# Patient Record
Sex: Male | Born: 1945 | Race: White | Hispanic: No | Marital: Married | Smoking: Current every day smoker
Health system: Southern US, Community
[De-identification: ages and names within clinical notes are randomized; demographics above are authoritative.]

## PROBLEM LIST (undated history)

## (undated) DIAGNOSIS — I2510 Atherosclerotic heart disease of native coronary artery without angina pectoris: Secondary | ICD-10-CM

## (undated) DIAGNOSIS — F419 Anxiety disorder, unspecified: Secondary | ICD-10-CM

## (undated) DIAGNOSIS — J40 Bronchitis, not specified as acute or chronic: Secondary | ICD-10-CM

## (undated) HISTORY — PX: HERNIA REPAIR: SHX51

---

## 2001-10-20 ENCOUNTER — Encounter (HOSPITAL_COMMUNITY): Admission: RE | Admit: 2001-10-20 | Discharge: 2002-01-18 | Payer: Self-pay | Admitting: Family Medicine

## 2002-05-12 ENCOUNTER — Encounter (HOSPITAL_COMMUNITY): Admission: RE | Admit: 2002-05-12 | Discharge: 2002-08-10 | Payer: Self-pay | Admitting: Family Medicine

## 2003-01-15 ENCOUNTER — Encounter (HOSPITAL_COMMUNITY): Admission: RE | Admit: 2003-01-15 | Discharge: 2003-04-15 | Payer: Self-pay | Admitting: Family Medicine

## 2003-05-07 ENCOUNTER — Encounter (HOSPITAL_COMMUNITY): Admission: RE | Admit: 2003-05-07 | Discharge: 2003-08-05 | Payer: Self-pay | Admitting: Family Medicine

## 2003-10-22 ENCOUNTER — Encounter: Admission: RE | Admit: 2003-10-22 | Discharge: 2004-01-20 | Payer: Self-pay | Admitting: Family Medicine

## 2004-03-03 ENCOUNTER — Encounter (HOSPITAL_COMMUNITY): Admission: RE | Admit: 2004-03-03 | Discharge: 2004-03-06 | Payer: Self-pay | Admitting: Family Medicine

## 2004-06-16 ENCOUNTER — Encounter (HOSPITAL_COMMUNITY): Admission: RE | Admit: 2004-06-16 | Discharge: 2004-09-14 | Payer: Self-pay | Admitting: Family Medicine

## 2004-11-23 ENCOUNTER — Encounter (HOSPITAL_COMMUNITY): Admission: RE | Admit: 2004-11-23 | Discharge: 2005-02-21 | Payer: Self-pay | Admitting: Family Medicine

## 2005-04-20 ENCOUNTER — Encounter (HOSPITAL_COMMUNITY): Admission: RE | Admit: 2005-04-20 | Discharge: 2005-07-19 | Payer: Self-pay | Admitting: Family Medicine

## 2006-01-18 ENCOUNTER — Ambulatory Visit (HOSPITAL_COMMUNITY): Admission: RE | Admit: 2006-01-18 | Discharge: 2006-01-18 | Payer: Self-pay | Admitting: Family Medicine

## 2006-05-24 ENCOUNTER — Encounter (HOSPITAL_COMMUNITY): Admission: RE | Admit: 2006-05-24 | Discharge: 2006-08-07 | Payer: Self-pay | Admitting: Family Medicine

## 2006-11-01 ENCOUNTER — Encounter (HOSPITAL_COMMUNITY): Admission: RE | Admit: 2006-11-01 | Discharge: 2006-12-27 | Payer: Self-pay | Admitting: Family Medicine

## 2007-01-31 ENCOUNTER — Ambulatory Visit: Payer: Self-pay | Admitting: Internal Medicine

## 2007-04-15 ENCOUNTER — Encounter (HOSPITAL_COMMUNITY): Admission: RE | Admit: 2007-04-15 | Discharge: 2007-04-22 | Payer: Self-pay | Admitting: Family Medicine

## 2007-10-07 ENCOUNTER — Encounter (HOSPITAL_COMMUNITY): Admission: RE | Admit: 2007-10-07 | Discharge: 2007-12-24 | Payer: Self-pay | Admitting: Family Medicine

## 2008-03-05 ENCOUNTER — Ambulatory Visit (HOSPITAL_COMMUNITY): Admission: RE | Admit: 2008-03-05 | Discharge: 2008-03-05 | Payer: Self-pay | Admitting: Family Medicine

## 2008-03-19 ENCOUNTER — Ambulatory Visit (HOSPITAL_COMMUNITY): Admission: RE | Admit: 2008-03-19 | Discharge: 2008-03-19 | Payer: Self-pay | Admitting: Family Medicine

## 2008-07-02 ENCOUNTER — Ambulatory Visit (HOSPITAL_COMMUNITY): Admission: RE | Admit: 2008-07-02 | Discharge: 2008-07-02 | Payer: Self-pay | Admitting: Family Medicine

## 2008-07-23 ENCOUNTER — Ambulatory Visit (HOSPITAL_COMMUNITY): Admission: RE | Admit: 2008-07-23 | Discharge: 2008-07-23 | Payer: Self-pay | Admitting: Family Medicine

## 2009-02-16 ENCOUNTER — Encounter (HOSPITAL_COMMUNITY): Admission: RE | Admit: 2009-02-16 | Discharge: 2009-05-17 | Payer: Self-pay | Admitting: Family Medicine

## 2009-07-05 ENCOUNTER — Encounter (HOSPITAL_COMMUNITY): Admission: RE | Admit: 2009-07-05 | Discharge: 2009-08-25 | Payer: Self-pay | Admitting: Family Medicine

## 2009-11-04 ENCOUNTER — Ambulatory Visit (HOSPITAL_COMMUNITY): Admission: RE | Admit: 2009-11-04 | Discharge: 2009-11-04 | Payer: Self-pay | Admitting: Family Medicine

## 2010-01-13 ENCOUNTER — Encounter (HOSPITAL_COMMUNITY)
Admission: RE | Admit: 2010-01-13 | Discharge: 2010-04-13 | Payer: Self-pay | Source: Home / Self Care | Attending: Family Medicine | Admitting: Family Medicine

## 2010-05-14 ENCOUNTER — Encounter: Payer: Self-pay | Admitting: Family Medicine

## 2011-01-23 LAB — FERRITIN: Ferritin: 63 (ref 22–322)

## 2011-02-16 ENCOUNTER — Other Ambulatory Visit: Payer: Self-pay | Admitting: Family Medicine

## 2011-02-16 ENCOUNTER — Ambulatory Visit (HOSPITAL_COMMUNITY): Payer: Self-pay | Attending: Family Medicine

## 2011-02-16 LAB — POCT HEMOGLOBIN-HEMACUE: Hemoglobin: 19.6 g/dL — ABNORMAL HIGH (ref 13.0–17.0)

## 2011-06-04 DIAGNOSIS — M62838 Other muscle spasm: Secondary | ICD-10-CM | POA: Diagnosis not present

## 2011-06-04 DIAGNOSIS — Z125 Encounter for screening for malignant neoplasm of prostate: Secondary | ICD-10-CM | POA: Diagnosis not present

## 2011-06-04 DIAGNOSIS — F411 Generalized anxiety disorder: Secondary | ICD-10-CM | POA: Diagnosis not present

## 2011-06-04 DIAGNOSIS — Z1211 Encounter for screening for malignant neoplasm of colon: Secondary | ICD-10-CM | POA: Diagnosis not present

## 2011-06-04 DIAGNOSIS — Z23 Encounter for immunization: Secondary | ICD-10-CM | POA: Diagnosis not present

## 2011-06-04 DIAGNOSIS — Z0000 Encounter for general adult medical examination without abnormal findings: Secondary | ICD-10-CM | POA: Diagnosis not present

## 2011-06-04 DIAGNOSIS — E7800 Pure hypercholesterolemia, unspecified: Secondary | ICD-10-CM | POA: Diagnosis not present

## 2011-07-09 DIAGNOSIS — N401 Enlarged prostate with lower urinary tract symptoms: Secondary | ICD-10-CM | POA: Diagnosis not present

## 2011-07-09 DIAGNOSIS — R972 Elevated prostate specific antigen [PSA]: Secondary | ICD-10-CM | POA: Diagnosis not present

## 2011-07-09 DIAGNOSIS — N529 Male erectile dysfunction, unspecified: Secondary | ICD-10-CM | POA: Diagnosis not present

## 2011-07-09 DIAGNOSIS — E291 Testicular hypofunction: Secondary | ICD-10-CM | POA: Diagnosis not present

## 2011-09-03 DIAGNOSIS — E7800 Pure hypercholesterolemia, unspecified: Secondary | ICD-10-CM | POA: Diagnosis not present

## 2011-09-04 ENCOUNTER — Other Ambulatory Visit: Payer: Self-pay | Admitting: Family Medicine

## 2011-09-14 ENCOUNTER — Ambulatory Visit (HOSPITAL_COMMUNITY)
Admission: RE | Admit: 2011-09-14 | Discharge: 2011-09-14 | Disposition: A | Discharge status: Home / Self Care | Payer: Medicare Other | Source: Ambulatory Visit | Attending: Family Medicine | Admitting: Family Medicine

## 2011-09-14 MED ORDER — LIDOCAINE HCL 1 % IJ SOLN
0.1000 mL | Freq: Once | INTRAMUSCULAR | Status: DC
  Filled 2011-09-14: qty 0.1

## 2011-09-14 MED ORDER — LIDOCAINE HCL 2 % IJ SOLN: 0.1000 mL | Freq: Once | INTRAMUSCULAR | Status: DC

## 2011-09-14 MED ORDER — LIDOCAINE 4 % EX CREA: TOPICAL_CREAM | Freq: Once | CUTANEOUS | Status: DC

## 2011-09-14 MED ORDER — LIDOCAINE HCL (PF) 1 % IJ SOLN
INTRAMUSCULAR | Status: AC
  Administered 2011-09-14: 5 mL
  Filled 2011-09-14: qty 5

## 2011-09-19 DIAGNOSIS — R972 Elevated prostate specific antigen [PSA]: Secondary | ICD-10-CM | POA: Diagnosis not present

## 2011-09-26 DIAGNOSIS — R972 Elevated prostate specific antigen [PSA]: Secondary | ICD-10-CM | POA: Diagnosis not present

## 2011-09-26 DIAGNOSIS — N401 Enlarged prostate with lower urinary tract symptoms: Secondary | ICD-10-CM | POA: Diagnosis not present

## 2011-11-02 ENCOUNTER — Other Ambulatory Visit: Payer: Self-pay | Admitting: Family Medicine

## 2011-11-09 ENCOUNTER — Ambulatory Visit (HOSPITAL_COMMUNITY)
Admission: RE | Admit: 2011-11-09 | Discharge: 2011-11-09 | Disposition: A | Discharge status: Home / Self Care | Payer: Medicare Other | Source: Ambulatory Visit | Attending: Family Medicine | Admitting: Family Medicine

## 2011-11-09 MED ORDER — LIDOCAINE 4 % EX CREA
TOPICAL_CREAM | Freq: Once | CUTANEOUS | Status: DC
  Filled 2011-11-09: qty 5

## 2011-11-09 MED ORDER — LIDOCAINE HCL (PF) 1 % IJ SOLN
INTRAMUSCULAR | Status: AC
  Administered 2011-11-09: 0.3 mL
  Filled 2011-11-09: qty 2

## 2011-11-09 NOTE — Progress Notes (Signed)
1340 500 cc phlebotomy left acf without incident.  Patient taking po's. Family at bedside

## 2011-12-25 DIAGNOSIS — R972 Elevated prostate specific antigen [PSA]: Secondary | ICD-10-CM | POA: Diagnosis not present

## 2011-12-31 DIAGNOSIS — E291 Testicular hypofunction: Secondary | ICD-10-CM | POA: Diagnosis not present

## 2011-12-31 DIAGNOSIS — N401 Enlarged prostate with lower urinary tract symptoms: Secondary | ICD-10-CM | POA: Diagnosis not present

## 2011-12-31 DIAGNOSIS — R972 Elevated prostate specific antigen [PSA]: Secondary | ICD-10-CM | POA: Diagnosis not present

## 2012-01-22 DIAGNOSIS — F411 Generalized anxiety disorder: Secondary | ICD-10-CM | POA: Diagnosis not present

## 2012-01-22 DIAGNOSIS — R252 Cramp and spasm: Secondary | ICD-10-CM | POA: Diagnosis not present

## 2012-01-22 DIAGNOSIS — B356 Tinea cruris: Secondary | ICD-10-CM | POA: Diagnosis not present

## 2012-01-22 DIAGNOSIS — E7800 Pure hypercholesterolemia, unspecified: Secondary | ICD-10-CM | POA: Diagnosis not present

## 2012-01-22 DIAGNOSIS — L989 Disorder of the skin and subcutaneous tissue, unspecified: Secondary | ICD-10-CM | POA: Diagnosis not present

## 2012-01-22 DIAGNOSIS — J209 Acute bronchitis, unspecified: Secondary | ICD-10-CM | POA: Diagnosis not present

## 2012-01-23 ENCOUNTER — Other Ambulatory Visit: Payer: Self-pay | Admitting: Family Medicine

## 2012-01-31 ENCOUNTER — Encounter (HOSPITAL_COMMUNITY)
Admission: RE | Admit: 2012-01-31 | Discharge: 2012-01-31 | Disposition: A | Discharge status: Home / Self Care | Payer: Medicare Other | Source: Ambulatory Visit | Attending: Family Medicine | Admitting: Family Medicine

## 2012-01-31 MED ORDER — LIDOCAINE HCL 2 % IJ SOLN
0.1000 mL | Freq: Once | INTRAMUSCULAR | Status: AC
  Administered 2012-01-31: 2 mg via INTRADERMAL
  Filled 2012-01-31: qty 10

## 2012-01-31 MED ORDER — LIDOCAINE 5 % EX OINT: TOPICAL_OINTMENT | Freq: Once | CUTANEOUS | Status: DC

## 2012-01-31 NOTE — Progress Notes (Signed)
1225 500 cc phlebotomy left antecubital.  Patient tolerated well. Taking po's.

## 2012-02-01 DIAGNOSIS — L570 Actinic keratosis: Secondary | ICD-10-CM | POA: Diagnosis not present

## 2012-02-01 DIAGNOSIS — D239 Other benign neoplasm of skin, unspecified: Secondary | ICD-10-CM | POA: Diagnosis not present

## 2012-02-01 DIAGNOSIS — L821 Other seborrheic keratosis: Secondary | ICD-10-CM | POA: Diagnosis not present

## 2012-02-01 DIAGNOSIS — D485 Neoplasm of uncertain behavior of skin: Secondary | ICD-10-CM | POA: Diagnosis not present

## 2012-02-03 ENCOUNTER — Emergency Department (HOSPITAL_COMMUNITY)
Admission: EM | Admit: 2012-02-03 | Discharge: 2012-02-03 | Disposition: A | Discharge status: Home / Self Care | Payer: Medicare Other | Attending: Emergency Medicine | Admitting: Emergency Medicine

## 2012-02-03 ENCOUNTER — Encounter (HOSPITAL_COMMUNITY): Payer: Self-pay | Admitting: *Deleted

## 2012-02-03 ENCOUNTER — Other Ambulatory Visit: Payer: Self-pay

## 2012-02-03 DIAGNOSIS — R110 Nausea: Secondary | ICD-10-CM | POA: Insufficient documentation

## 2012-02-03 DIAGNOSIS — F10239 Alcohol dependence with withdrawal, unspecified: Secondary | ICD-10-CM

## 2012-02-03 DIAGNOSIS — F10939 Alcohol use, unspecified with withdrawal, unspecified: Secondary | ICD-10-CM | POA: Insufficient documentation

## 2012-02-03 DIAGNOSIS — R42 Dizziness and giddiness: Secondary | ICD-10-CM | POA: Insufficient documentation

## 2012-02-03 DIAGNOSIS — I2510 Atherosclerotic heart disease of native coronary artery without angina pectoris: Secondary | ICD-10-CM | POA: Insufficient documentation

## 2012-02-03 HISTORY — DX: Atherosclerotic heart disease of native coronary artery without angina pectoris: I25.10

## 2012-02-03 LAB — COMPREHENSIVE METABOLIC PANEL
CO2: 23 mEq/L (ref 19–32)
Calcium: 9.4 mg/dL (ref 8.4–10.5)
Creatinine, Ser: 1.06 mg/dL (ref 0.50–1.35)
GFR calc Af Amer: 83 mL/min — ABNORMAL LOW (ref >90)
GFR calc non Af Amer: 72 mL/min — ABNORMAL LOW (ref >90)
Glucose, Bld: 139 mg/dL — ABNORMAL HIGH (ref 70–99)

## 2012-02-03 LAB — URINALYSIS, ROUTINE W REFLEX MICROSCOPIC
Glucose, UA: NEGATIVE mg/dL
Hgb urine dipstick: NEGATIVE
Ketones, ur: 15 mg/dL — AB
Protein, ur: NEGATIVE mg/dL
Urobilinogen, UA: 1 mg/dL (ref 0.0–1.0)

## 2012-02-03 LAB — LIPASE, BLOOD: Lipase: 57 U/L (ref 11–59)

## 2012-02-03 LAB — CBC WITH DIFFERENTIAL/PLATELET
Basophils Relative: 0 % (ref 0–1)
Hemoglobin: 16.7 g/dL (ref 13.0–17.0)
Lymphs Abs: 2.9 10*3/uL (ref 0.7–4.0)
MCHC: 36.1 g/dL — ABNORMAL HIGH (ref 30.0–36.0)
Monocytes Relative: 8 % (ref 3–12)
Neutro Abs: 5.3 10*3/uL (ref 1.7–7.7)
Neutrophils Relative %: 58 % (ref 43–77)
RBC: 4.78 MIL/uL (ref 4.22–5.81)

## 2012-02-03 LAB — GLUCOSE, CAPILLARY: Glucose-Capillary: 133 mg/dL — ABNORMAL HIGH (ref 70–99)

## 2012-02-03 MED ORDER — LORAZEPAM 1 MG PO TABS: 1.0000 mg | ORAL_TABLET | Freq: Three times a day (TID) | ORAL | Status: DC | PRN

## 2012-02-03 MED ORDER — LORAZEPAM 1 MG PO TABS
1.0000 mg | ORAL_TABLET | Freq: Once | ORAL | Status: AC
  Administered 2012-02-03: 1 mg via ORAL
  Filled 2012-02-03: qty 1

## 2012-02-03 MED ORDER — ONDANSETRON 4 MG PO TBDP
8.0000 mg | ORAL_TABLET | Freq: Once | ORAL | Status: AC
  Administered 2012-02-03: 8 mg via ORAL
  Filled 2012-02-03: qty 2

## 2012-02-03 NOTE — ED Notes (Signed)
PT states that he started having shaking and lightheadedness that started around 0300.  CBG 133.  PT drinks everyday and states last drink was 1 days.  Pt states that he does not feel well.  Pt denies chest pain.  Pt usually drinks 6 beers per day

## 2012-02-03 NOTE — ED Notes (Signed)
Pt anxious to leave. MD Lynelle Doctor made aware. Apologized to pt for delay.

## 2012-02-03 NOTE — ED Notes (Signed)
Pt ambulated to bathroom for urine specimen.

## 2012-02-03 NOTE — ED Notes (Addendum)
Pt presents today with increased anxiety, dizziness, excess gas and overall "not feeling like myself" x 1 day. Pt reports drinking 6 beers/day, but "doesn't taste right to me." Pt last drank yesterday. Denies CP, SOB, or pain.

## 2012-02-03 NOTE — ED Provider Notes (Cosign Needed)
History     CSN: [id number]  Arrival date & time 02/03/12  1439   First MD Initiated Contact with Patient 02/03/12 1527      Chief Complaint  Patient presents with  . Dizziness  . Shaking    (Consider location/radiation/quality/duration/timing/severity/associated sxs/prior treatment) HPI  Patient reports he normally drinks about 6 beers a day. He states yesterday had some nausea and felt the need to eat. Reports about 3 AM he had nausea and has been having sweats off and on, he states he has a lot of abdominal gas but denies abdominal pain or diarrhea. He relates he has gout and felt like he was getting some pain in his right foot and ankle and took 2 Indocin. He reports he normally drinks 6 beers a day however he stopped drinking yesterday at 5 PM which is earlier than usual. He states he feels shaky and nervous. He also states he feels lightheaded. Despite the nausea he did eat normally today. Patient seems to feel his symptoms are related to switching brands of coffee. His wife states she notices a change in taste that she does not feel that switch.  Patient has hemochromatosis and he had a phlebotomy done 3 days ago  PCP Dr. Wynelle Link  Past Medical History  Diagnosis Date  . Coronary artery disease     Past Surgical History  Procedure Date  . Hernia repair     No family history on file.  History  Substance Use Topics  . Smoking status: Current Every Day Smoker 1 1/2 PPD  . Smokeless tobacco: Not on file  . Alcohol Use: Yes     6 beers per day   Lives with spouse Lives at home Retired  Review of Systems  All other systems reviewed and are negative.    Allergies  Other  Home Medications   Current Outpatient Rx  Name Route Sig Dispense Refill  . ALPRAZOLAM 0.5 MG PO TABS Oral Take 0.5 mg by mouth daily.    . ASPIRIN 325 MG PO TABS Oral Take 325 mg by mouth daily.    . INDOMETHACIN 50 MG PO CAPS Oral Take 50 mg by mouth 3 (three) times daily as needed. For gout  flare up    . ROSUVASTATIN CALCIUM 10 MG PO TABS Oral Take 10 mg by mouth daily.      BP 127/73  Pulse 63  Temp 97.5 F (36.4 C) (Oral)  Resp 18  SpO2 97%  Vital signs normal    Physical Exam  Nursing note and vitals reviewed. Constitutional: He is oriented to person, place, and time. He appears well-developed and well-nourished.  Non-toxic appearance. He does not appear ill. No distress.  HENT:  Head: Normocephalic and atraumatic.  Right Ear: External ear normal.  Left Ear: External ear normal.  Nose: Nose normal. No mucosal edema or rhinorrhea.  Mouth/Throat: Oropharynx is clear and moist and mucous membranes are normal. No dental abscesses or uvula swelling.  Eyes: Conjunctivae normal and EOM are normal. Pupils are equal, round, and reactive to light.  Neck: Normal range of motion and full passive range of motion without pain. Neck supple.  Cardiovascular: Normal rate, regular rhythm and normal heart sounds.  Exam reveals no gallop and no friction rub.   No murmur heard. Pulmonary/Chest: Effort normal and breath sounds normal. No respiratory distress. He has no wheezes. He has no rhonchi. He has no rales. He exhibits no tenderness and no crepitus.  Abdominal: Soft. Normal appearance and  bowel sounds are normal. He exhibits no distension. There is no tenderness. There is no rebound and no guarding.  Musculoskeletal: Normal range of motion. He exhibits no edema and no tenderness.       Moves all extremities well.   Neurological: He is alert and oriented to person, place, and time. He has normal strength. No cranial nerve deficit.       Pt has mild tremor of tongue and arms  Skin: Skin is warm, dry and intact. No rash noted. No erythema. No pallor.  Psychiatric: He has a normal mood and affect. His speech is normal and behavior is normal. His mood appears not anxious.    ED Course  Procedures (including critical care time)   Medications  ondansetron (ZOFRAN-ODT)  disintegrating tablet 8 mg (8 mg Oral Given 02/03/12 1647)  LORazepam (ATIVAN) e tablet 1 mg (1 mg Oral Given 02/03/12 1647)   Recheck at time of discharge. Patient states he feels much improved, the shakiness is all gone. He states he has talked to Dr. Wynelle Link about a alcohol treatment program but accepts Medicare and he is going to followup with her this week. He states he wants to quit drinking and he and his wife are also to stop smoking.   Results for orders placed during the hospital encounter of 02/03/12  COMPREHENSIVE METABOLIC PANEL      Component Value Range   Sodium 138  135 - 145 mEq/L   Potassium 3.4 (*) 3.5 - 5.1 mEq/L   Chloride 100  96 - 112 mEq/L   CO2 23  19 - 32 mEq/L   Glucose, Bld 139 (*) 70 - 99 mg/dL   BUN 11  6 - 23 mg/dL   Creatinine, Ser 9.60  0.50 - 1.35 mg/dL   Calcium 9.4  8.4 - 45.4 mg/dL   Total Protein 6.5  6.0 - 8.3 g/dL   Albumin 3.5  3.5 - 5.2 g/dL   AST 27  0 - 37 U/L   ALT 22  0 - 53 U/L   Alkaline Phosphatase 87  39 - 117 U/L   Total Bilirubin 0.9  0.3 - 1.2 mg/dL   GFR calc non Af Amer 72 (*) >90 mL/min   GFR calc Af Amer 83 (*) >90 mL/min  CBC WITH DIFFERENTIAL      Component Value Range   WBC 9.0  4.0 - 10.5 K/uL   RBC 4.78  4.22 - 5.81 MIL/uL   Hemoglobin 16.7  13.0 - 17.0 g/dL   HCT 09.8  11.9 - 14.7 %   MCV 96.9  78.0 - 100.0 fL   MCH 34.9 (*) 26.0 - 34.0 pg   MCHC 36.1 (*) 30.0 - 36.0 g/dL   RDW 82.9  56.2 - 13.0 %   Platelets 173  150 - 400 K/uL   Neutrophils Relative 58  43 - 77 %   Neutro Abs 5.3  1.7 - 7.7 K/uL   Lymphocytes Relative 32  12 - 46 %   Lymphs Abs 2.9  0.7 - 4.0 K/uL   Monocytes Relative 8  3 - 12 %   Monocytes Absolute 0.7  0.1 - 1.0 K/uL   Eosinophils Relative 1  0 - 5 %   Eosinophils Absolute 0.1  0.0 - 0.7 K/uL   Basophils Relative 0  0 - 1 %   Basophils Absolute 0.0  0.0 - 0.1 K/uL  GLUCOSE, CAPILLARY      Component Value Range   Glucose-Capillary 133 (*)  70 - 99 mg/dL  LIPASE, BLOOD      Component Value  Range   Lipase 57  11 - 59 U/L  TROPONIN I      Component Value Range   Troponin I <0.30  <0.30 ng/mL  URINALYSIS, ROUTINE W REFLEX MICROSCOPIC      Component Value Range   Color, Urine AMBER (*) YELLOW   APPearance CLEAR  CLEAR   Specific Gravity, Urine 1.024  1.005 - 1.030   pH 6.0  5.0 - 8.0   Glucose, UA NEGATIVE  NEGATIVE mg/dL   Hgb urine dipstick NEGATIVE  NEGATIVE   Bilirubin Urine SMALL (*) NEGATIVE   Ketones, ur 15 (*) NEGATIVE mg/dL   Protein, ur NEGATIVE  NEGATIVE mg/dL   Urobilinogen, UA 1.0  0.0 - 1.0 mg/dL   Nitrite NEGATIVE  NEGATIVE   Leukocytes, UA TRACE (*) NEGATIVE  URINE MICROSCOPIC-ADD ON      Component Value Range   WBC, UA 0-2  <3 WBC/hpf   Laboratory interpretation all normal except mild hypokalemia, concentrated urine   Date: 02/03/2012  Rate: 83  Rhythm: normal sinus rhythm  QRS Axis: normal  Intervals: normal  ST/T Wave abnormalities: normal  Conduction Disutrbances:none  Narrative Interpretation: artifact from tremor  Old EKG Reviewed: none available     1. Alcohol withdrawal     New Prescriptions   LORAZEPAM (ATIVAN) 1 MG TABLET    Take 1 tablet (1 mg total) by mouth 3 (three) times daily as needed for anxiety.    Plan discharge  Devoria Albe, MD, FACEP   MDM          Ward Givens, MD 02/03/12 1900

## 2012-05-17 ENCOUNTER — Other Ambulatory Visit: Payer: Self-pay | Admitting: Family Medicine

## 2012-05-20 ENCOUNTER — Other Ambulatory Visit (HOSPITAL_COMMUNITY): Payer: Self-pay | Admitting: *Deleted

## 2012-05-21 ENCOUNTER — Inpatient Hospital Stay (HOSPITAL_COMMUNITY): Admission: RE | Admit: 2012-05-21 | Payer: Medicare Other | Source: Ambulatory Visit

## 2012-05-22 ENCOUNTER — Other Ambulatory Visit (HOSPITAL_COMMUNITY): Payer: Self-pay | Admitting: *Deleted

## 2012-05-23 ENCOUNTER — Encounter (HOSPITAL_COMMUNITY)
Admission: RE | Admit: 2012-05-23 | Discharge: 2012-05-23 | Disposition: A | Discharge status: Home / Self Care | Payer: Medicare Other | Source: Ambulatory Visit | Attending: Family Medicine | Admitting: Family Medicine

## 2012-05-23 MED ORDER — LIDOCAINE HCL (PF) 1 % IJ SOLN
INTRAMUSCULAR | Status: AC
  Administered 2012-05-23: 2 mL via INTRADERMAL
  Filled 2012-05-23: qty 2

## 2012-05-23 MED ORDER — LIDOCAINE HCL (PF) 1 % IJ SOLN
2.0000 mL | Freq: Once | INTRAMUSCULAR | Status: AC
  Administered 2012-05-23: 2 mL via INTRADERMAL

## 2012-05-23 NOTE — Progress Notes (Signed)
Discharged home after theraputic phlebotomy using left antecubital; pt. Tolerated well

## 2012-07-08 DIAGNOSIS — L03319 Cellulitis of trunk, unspecified: Secondary | ICD-10-CM | POA: Diagnosis not present

## 2012-07-08 DIAGNOSIS — L02219 Cutaneous abscess of trunk, unspecified: Secondary | ICD-10-CM | POA: Diagnosis not present

## 2012-09-30 ENCOUNTER — Other Ambulatory Visit: Payer: Self-pay | Admitting: Family Medicine

## 2012-10-01 ENCOUNTER — Other Ambulatory Visit: Payer: Self-pay | Admitting: Family Medicine

## 2012-10-09 ENCOUNTER — Encounter (HOSPITAL_COMMUNITY)
Admission: RE | Admit: 2012-10-09 | Discharge: 2012-10-09 | Disposition: A | Discharge status: Home / Self Care | Payer: Medicare Other | Source: Ambulatory Visit | Attending: Family Medicine | Admitting: Family Medicine

## 2012-10-09 MED ORDER — LIDOCAINE HCL (PF) 2 % IJ SOLN
2.0000 mL | Freq: Once | INTRAMUSCULAR | Status: AC
  Administered 2012-10-09: 2 mL via INTRADERMAL
  Filled 2012-10-09: qty 2

## 2012-10-17 DIAGNOSIS — E7800 Pure hypercholesterolemia, unspecified: Secondary | ICD-10-CM | POA: Diagnosis not present

## 2012-10-17 DIAGNOSIS — Z1331 Encounter for screening for depression: Secondary | ICD-10-CM | POA: Diagnosis not present

## 2012-10-17 DIAGNOSIS — M545 Low back pain: Secondary | ICD-10-CM | POA: Diagnosis not present

## 2012-10-17 DIAGNOSIS — F17200 Nicotine dependence, unspecified, uncomplicated: Secondary | ICD-10-CM | POA: Diagnosis not present

## 2012-12-09 DIAGNOSIS — D126 Benign neoplasm of colon, unspecified: Secondary | ICD-10-CM | POA: Diagnosis not present

## 2012-12-09 DIAGNOSIS — K5730 Diverticulosis of large intestine without perforation or abscess without bleeding: Secondary | ICD-10-CM | POA: Diagnosis not present

## 2012-12-09 DIAGNOSIS — Z1211 Encounter for screening for malignant neoplasm of colon: Secondary | ICD-10-CM | POA: Diagnosis not present

## 2012-12-09 DIAGNOSIS — K648 Other hemorrhoids: Secondary | ICD-10-CM | POA: Diagnosis not present

## 2012-12-11 ENCOUNTER — Encounter (HOSPITAL_COMMUNITY): Payer: Self-pay | Admitting: *Deleted

## 2012-12-11 ENCOUNTER — Observation Stay (HOSPITAL_COMMUNITY)
Admission: EM | Admit: 2012-12-11 | Discharge: 2012-12-13 | Disposition: A | Discharge status: Home / Self Care | Payer: Medicare Other | Attending: Gastroenterology | Admitting: Gastroenterology

## 2012-12-11 DIAGNOSIS — Z860100 Personal history of colon polyps, unspecified: Secondary | ICD-10-CM | POA: Insufficient documentation

## 2012-12-11 DIAGNOSIS — IMO0002 Reserved for concepts with insufficient information to code with codable children: Principal | ICD-10-CM | POA: Insufficient documentation

## 2012-12-11 DIAGNOSIS — D62 Acute posthemorrhagic anemia: Secondary | ICD-10-CM | POA: Insufficient documentation

## 2012-12-11 DIAGNOSIS — Z79899 Other long term (current) drug therapy: Secondary | ICD-10-CM | POA: Insufficient documentation

## 2012-12-11 DIAGNOSIS — Y849 Medical procedure, unspecified as the cause of abnormal reaction of the patient, or of later complication, without mention of misadventure at the time of the procedure: Secondary | ICD-10-CM | POA: Insufficient documentation

## 2012-12-11 DIAGNOSIS — K921 Melena: Secondary | ICD-10-CM | POA: Diagnosis not present

## 2012-12-11 DIAGNOSIS — D509 Iron deficiency anemia, unspecified: Secondary | ICD-10-CM | POA: Diagnosis not present

## 2012-12-11 HISTORY — DX: Anxiety disorder, unspecified: F41.9

## 2012-12-11 LAB — COMPREHENSIVE METABOLIC PANEL
ALT: 47 U/L (ref 0–53)
AST: 44 U/L — ABNORMAL HIGH (ref 0–37)
Albumin: 3.4 g/dL — ABNORMAL LOW (ref 3.5–5.2)
Alkaline Phosphatase: 97 U/L (ref 39–117)
BUN: 15 mg/dL (ref 6–23)
CO2: 24 mEq/L (ref 19–32)
Calcium: 9.6 mg/dL (ref 8.4–10.5)
Chloride: 106 mEq/L (ref 96–112)
Creatinine, Ser: 1.03 mg/dL (ref 0.50–1.35)
GFR calc Af Amer: 85 mL/min — ABNORMAL LOW (ref >90)
GFR calc non Af Amer: 74 mL/min — ABNORMAL LOW (ref >90)
Glucose, Bld: 120 mg/dL — ABNORMAL HIGH (ref 70–99)
Potassium: 4.4 mEq/L (ref 3.5–5.1)
Sodium: 142 mEq/L (ref 135–145)
Total Bilirubin: 0.6 mg/dL (ref 0.3–1.2)
Total Protein: 6.6 g/dL (ref 6.0–8.3)

## 2012-12-11 LAB — CBC
HCT: 46.5 % (ref 39.0–52.0)
Hemoglobin: 15.9 g/dL (ref 13.0–17.0)
MCH: 34.6 pg — ABNORMAL HIGH (ref 26.0–34.0)
MCHC: 34.2 g/dL (ref 30.0–36.0)
MCV: 101.1 fL — ABNORMAL HIGH (ref 78.0–100.0)
Platelets: 185 10*3/uL (ref 150–400)
RBC: 4.6 MIL/uL (ref 4.22–5.81)
RDW: 12.5 % (ref 11.5–15.5)
WBC: 10.2 10*3/uL (ref 4.0–10.5)

## 2012-12-11 LAB — TYPE AND SCREEN
ABO/RH(D): O POS
Antibody Screen: NEGATIVE

## 2012-12-11 LAB — PROTIME-INR
INR: 0.89 (ref 0.00–1.49)
Prothrombin Time: 11.9 seconds (ref 11.6–15.2)

## 2012-12-11 LAB — ABO/RH: ABO/RH(D): O POS

## 2012-12-11 MED ORDER — SODIUM CHLORIDE 0.9 % IJ SOLN
3.0000 mL | Freq: Two times a day (BID) | INTRAMUSCULAR | Status: DC
  Administered 2012-12-11 – 2012-12-12 (×2): 3 mL via INTRAVENOUS

## 2012-12-11 MED ORDER — ONDANSETRON HCL 4 MG PO TABS: 4.0000 mg | ORAL_TABLET | Freq: Four times a day (QID) | ORAL | Status: DC | PRN

## 2012-12-11 MED ORDER — SODIUM CHLORIDE 0.9 % IV SOLN
INTRAVENOUS | Status: DC
  Administered 2012-12-11: 21:00:00 via INTRAVENOUS

## 2012-12-11 MED ORDER — CYCLOBENZAPRINE HCL 10 MG PO TABS: 10.0000 mg | ORAL_TABLET | Freq: Three times a day (TID) | ORAL | Status: DC | PRN

## 2012-12-11 MED ORDER — ZOLPIDEM TARTRATE 5 MG PO TABS: 5.0000 mg | ORAL_TABLET | Freq: Every evening | ORAL | Status: DC | PRN

## 2012-12-11 MED ORDER — ACETAMINOPHEN 650 MG RE SUPP: 650.0000 mg | Freq: Four times a day (QID) | RECTAL | Status: DC | PRN

## 2012-12-11 MED ORDER — ATORVASTATIN CALCIUM 10 MG PO TABS
10.0000 mg | ORAL_TABLET | Freq: Every day | ORAL | Status: DC
  Administered 2012-12-12: 10 mg via ORAL
  Filled 2012-12-11 (×2): qty 1

## 2012-12-11 MED ORDER — ACETAMINOPHEN 325 MG PO TABS: 650.0000 mg | ORAL_TABLET | Freq: Four times a day (QID) | ORAL | Status: DC | PRN

## 2012-12-11 MED ORDER — ALBUTEROL SULFATE HFA 108 (90 BASE) MCG/ACT IN AERS
2.0000 | INHALATION_SPRAY | Freq: Four times a day (QID) | RESPIRATORY_TRACT | Status: DC | PRN
  Filled 2012-12-11: qty 6.7

## 2012-12-11 MED ORDER — PEG 3350-KCL-NA BICARB-NACL 420 G PO SOLR
4000.0000 mL | Freq: Once | ORAL | Status: AC
  Administered 2012-12-11: 4000 mL via ORAL
  Filled 2012-12-11: qty 4000

## 2012-12-11 MED ORDER — ONDANSETRON HCL 4 MG/2ML IJ SOLN: 4.0000 mg | Freq: Four times a day (QID) | INTRAMUSCULAR | Status: DC | PRN

## 2012-12-11 MED ORDER — ALPRAZOLAM 0.5 MG PO TABS
0.5000 mg | ORAL_TABLET | Freq: Every day | ORAL | Status: DC
  Administered 2012-12-12 – 2012-12-13 (×2): 0.5 mg via ORAL
  Filled 2012-12-11 (×2): qty 1

## 2012-12-11 NOTE — ED Notes (Signed)
Pt reports having colonoscopy on 8/19 and starting today at 2pm had onset of severe diarrhea with large amounts of bright red blood. Appears diaphoretic and HR 135 at triage.

## 2012-12-11 NOTE — H&P (Signed)
[facility] Admission Note  Chief Complaint: hematochezia  HPI: Richard Mccarthy is an 67 y.o. male presenting with hematochezia.  Started this afternoon at 2pm.  Has had several episodes of cranberry-colored blood in stool.  4 episodes since in ED.  No abdominal pain, fevers, nausea, vomiting, hematemesis, melena.  Had colonoscopy two days ago, at which time multiple colon polyps were removed from multiple different colon locations, including a few large polyps in the ascending colon and cecum.    Past Medical History  Diagnosis Date  . Coronary artery disease   . Anxiety     Past Surgical History  Procedure Laterality Date  . Hernia repair       (Not in a hospital admission)  Allergies:  Allergies  Allergen Reactions  . Other Diarrhea    Gout medication-unknown name    History reviewed. No pertinent family history.  Social History:  reports that he has been smoking.  He does not have any smokeless tobacco history on file. He reports that  drinks alcohol. He reports that he does not use illicit drugs.   ROS: Positive = bikd Gen: Denies any fever, chills, rigors, sweats, anorexia, fatigue, weakness, malaise, involuntary weight loss, and sleep disorder CV: Denies chest pain, angina, palpitations, syncope, orthopnea, PND, peripheral edema, and claudication. Resp: Denies dyspnea (chronic, mild), cough, sputum, wheezing, coughing up blood. GI: Described in detail in HPI.   Bleeding. GU : Denies urinary burning, blood in urine, urinary frequency, urinary hesitancy, nocturnal urination, and urinary incontinence. MS: Denies joint pain or swelling (gout, chronic).  Denies muscle weakness, cramps, atrophy.  Derm: Denies rash, itching, oral ulcerations, hives, unhealing ulcers.  Psych: Denies depression, anxiety, memory loss, suicidal ideation, hallucinations,  and confusion. Heme: Denies bruising, bleeding, and enlarged lymph nodes. Neuro:  Denies any headaches, dizziness,  paresthesias. Endo:  Denies any problems with DM, thyroid, adrenal function.   Blood pressure 139/79, pulse 114, temperature 98.4 F (36.9 C), temperature source Oral, resp. rate 18, height 5\' 6"  (1.676 m), weight 83.915 kg (185 lb), SpO2 98.00%. General appearance: Non-toxic appearing, does not appear in acute distress Head: Normocephalic, atraumatic Eyes: Conjunctiva pink Neck: No JVD Resp: Diffusely poor aeration (chronic), worse at bases, no focal findings Cardio: Mild tachycardia, regular GI: Soft, non-tender, non-distended, modestly hyperactive bowel sounds Extremities: No edema Skin: No rashes or ecchymoses Neurologic: Non-focal without lateralizing signs Psych:  Normal mood, normal affect  Results for orders placed during the hospital encounter of 12/11/12 (from the past 48 hour(s))  CBC     Status: Abnormal   Collection Time    12/11/12  5:50 PM      Result Value Range   WBC 10.2  4.0 - 10.5 K/uL   RBC 4.60  4.22 - 5.81 MIL/uL   Hemoglobin 15.9  13.0 - 17.0 g/dL   HCT 16.1  09.6 - 04.5 %   MCV 101.1 (*) 78.0 - 100.0 fL   MCH 34.6 (*) 26.0 - 34.0 pg   MCHC 34.2  30.0 - 36.0 g/dL   RDW 40.9  81.1 - 91.4 %   Platelets 185  150 - 400 K/uL   No results found.  Assessment:  1.  Suspected post-polypectomy bleeding.  Hemodynamically stable.  Hgb normal, but bleeding ongoing so suspect hgb will drop overnight.  Plan:  1.  Admit to [facility] for observation to telemetry unit. 2.  Repeat CBC in AM. 3.  Gentle IV hydration. 4.  Clear liquids, NPO after midnight. 5.  Bowel preparation for possible colonoscopy tomorrow morning, if bleeding persists. 6.  Continue home medications. 7.  DVT prophylaxis with SCDs.  Freddy Jaksch 12/11/2012, 6:12 PM

## 2012-12-12 DIAGNOSIS — D509 Iron deficiency anemia, unspecified: Secondary | ICD-10-CM | POA: Diagnosis not present

## 2012-12-12 DIAGNOSIS — IMO0002 Reserved for concepts with insufficient information to code with codable children: Secondary | ICD-10-CM | POA: Diagnosis not present

## 2012-12-12 DIAGNOSIS — K921 Melena: Secondary | ICD-10-CM | POA: Diagnosis not present

## 2012-12-12 LAB — BASIC METABOLIC PANEL
CO2: 22 mEq/L (ref 19–32)
Calcium: 7.8 mg/dL — ABNORMAL LOW (ref 8.4–10.5)
Chloride: 105 mEq/L (ref 96–112)
Creatinine, Ser: 0.87 mg/dL (ref 0.50–1.35)
Glucose, Bld: 124 mg/dL — ABNORMAL HIGH (ref 70–99)

## 2012-12-12 LAB — CBC
HCT: 30.7 % — ABNORMAL LOW (ref 39.0–52.0)
MCH: 33.7 pg (ref 26.0–34.0)
MCV: 99.4 fL (ref 78.0–100.0)
Platelets: 141 10*3/uL — ABNORMAL LOW (ref 150–400)
RBC: 3.09 MIL/uL — ABNORMAL LOW (ref 4.22–5.81)
RDW: 12.6 % (ref 11.5–15.5)

## 2012-12-12 NOTE — Progress Notes (Signed)
Subjective: Bleeding stopped last night. No abdominal pain.  Objective: Vital signs in last 24 hours: Temp:  [97.5 F (36.4 C)-98.9 F (37.2 C)] 98.9 F (37.2 C) (08/22 0755) Pulse Rate:  [79-124] 79 (08/22 0755) Resp:  [16-32] 22 (08/22 0755) BP: (103-139)/(62-87) 132/62 mmHg (08/22 0755) SpO2:  [96 %-100 %] 99 % (08/22 0755) Weight:  [74.4 kg (164 lb 0.4 oz)-83.915 kg (185 lb)] 74.4 kg (164 lb 0.4 oz) (08/21 2041) Weight change:  Last BM Date: 12/12/12  PE: GEN:  NAD ABD:  Soft, active bowel sounds  Lab Results: CMP     Component Value Date/Time   NA 136 12/12/2012 0443   K 4.6 12/12/2012 0443   CL 105 12/12/2012 0443   CO2 22 12/12/2012 0443   GLUCOSE 124* 12/12/2012 0443   BUN 13 12/12/2012 0443   CREATININE 0.87 12/12/2012 0443   CALCIUM 7.8* 12/12/2012 0443   PROT 6.6 12/11/2012 1750   ALBUMIN 3.4* 12/11/2012 1750   AST 44* 12/11/2012 1750   ALT 47 12/11/2012 1750   ALKPHOS 97 12/11/2012 1750   BILITOT 0.6 12/11/2012 1750   GFRNONAA 88* 12/12/2012 0443   GFRAA >90 12/12/2012 0443   CBC    Component Value Date/Time   WBC 7.8 12/12/2012 0443   RBC 3.09* 12/12/2012 0443   HGB 10.4* 12/12/2012 0443   HCT 30.7* 12/12/2012 0443   PLT 141* 12/12/2012 0443   MCV 99.4 12/12/2012 0443   MCH 33.7 12/12/2012 0443   MCHC 33.9 12/12/2012 0443   RDW 12.6 12/12/2012 0443   LYMPHSABS 2.9 02/03/2012 1510   MONOABS 0.7 02/03/2012 1510   EOSABS 0.1 02/03/2012 1510   BASOSABS 0.0 02/03/2012 1510   Assessment:  1.  Post-polypectomy bleeding, resolved. 2.  Acute blood loss anemia, due to #1 above.  Plan:  1.  Clear liquid diet, advance as tolerated. 2.  Hold off on colonoscopy, unless bleeding recurs. 3.  If tolerates diet, and no further bleeding, would likely plan to discharge tomorrow morning. 4.  No ASA/NSAIDs for at least another 7 days. 5.  Otherwise continue home medications. 6.  SCDs for DVT prophylaxis. 7.  OOBTC, ambulate with assistance.   Freddy Jaksch 12/12/2012,  9:06 AM

## 2012-12-12 NOTE — Progress Notes (Signed)
Utilization review completed.  

## 2012-12-13 DIAGNOSIS — K922 Gastrointestinal hemorrhage, unspecified: Secondary | ICD-10-CM | POA: Diagnosis not present

## 2012-12-13 DIAGNOSIS — K625 Hemorrhage of anus and rectum: Secondary | ICD-10-CM | POA: Diagnosis not present

## 2012-12-13 LAB — CBC
HCT: 29.5 % — ABNORMAL LOW (ref 39.0–52.0)
Hemoglobin: 10.1 g/dL — ABNORMAL LOW (ref 13.0–17.0)
MCV: 100 fL (ref 78.0–100.0)
RDW: 12.5 % (ref 11.5–15.5)
WBC: 5.7 10*3/uL (ref 4.0–10.5)

## 2012-12-13 NOTE — Progress Notes (Signed)
Utilization review completed.  

## 2012-12-13 NOTE — Progress Notes (Signed)
Paged Dr. Bosie Clos. Attempting to complete DC, No orders for DC to be released.

## 2012-12-13 NOTE — Discharge Summary (Addendum)
  [facility] Discharge Summary   Richard Mccarthy MRN: [id number]  Admit date: 12/11/2012 Discharge date: 12/13/2012  Admission Diagnoses: GI bleed  Discharge Diagnoses: Post-polypectomy bleed  Consults: None  History of Present Illness: Richard Mccarthy is an 67 y.o. male presenting with hematochezia that started the day of admit at 2pm. Has had several episodes of cranberry-colored blood in stool. 4 episodes since in ED. No abdominal pain, fevers, nausea, vomiting, hematemesis, melena. Had colonoscopy two days prior to admit, at which time multiple colon polyps were removed from multiple different colon locations, including a few large polyps in the ascending colon and cecum.    Hospital Course: Patient was admitted for hematochezia that started 2 days after having a colonoscopy where multiple polyps were removed. Bleeding resolved the night of admit. He was given NuLytely for colon prep with plans for a colonoscopy but this was cancelled when the bleeding resolved. No further bleeding occurred. Hgb 10.1 stable from yesterday 10.4 (15.9 on admit). Patient doing well today and tolerating soft diet. Denies abdominal pain or further rectal bleeding.  Discharged Condition: good  Disposition: 01-Home or Self Care     Medication List    STOP taking these medications       aspirin 325 MG tablet     indomethacin 50 MG capsule  Commonly known as:  INDOCIN     naproxen sodium 220 MG tablet  Commonly known as:  ANAPROX      TAKE these medications       albuterol 108 (90 BASE) MCG/ACT inhaler  Commonly known as:  PROVENTIL HFA;VENTOLIN HFA  Inhale 2 puffs into the lungs every 6 (six) hours as needed for wheezing or shortness of breath.     ALPRAZolam 0.5 MG tablet  Commonly known as:  XANAX  Take 0.5 mg by mouth daily.     rosuvastatin 10 MG tablet  Commonly known as:  CRESTOR  Take 10 mg by mouth daily.       Follow up: Follow up with Dr. Dulce Sellar in 1 week for CBC  and OV in 2 weeks (dates to be scheduled by our office)   Discharge time: 30 minutes Signed: Ty Oshima C. 12/13/2012, 11:15 AM

## 2012-12-13 NOTE — Progress Notes (Signed)
Paged On-call GI physician per patient request. Pt. Inquiring regarding DC. Needing to set up transportation with spouse. Awaiting call back.

## 2012-12-13 NOTE — Progress Notes (Signed)
Pt. To be discharged home today. Questions pertaining to discharge instruction answered. VSS. IV discontinued, site clean, dry, intact. Pt. Transported by private vehicle and wife. Pt. Transferred by wheelchair via NT. All belongings sent home with patient. Will continue to monitor until patient leaves campus.

## 2012-12-19 DIAGNOSIS — K921 Melena: Secondary | ICD-10-CM | POA: Diagnosis not present

## 2012-12-23 DIAGNOSIS — D239 Other benign neoplasm of skin, unspecified: Secondary | ICD-10-CM | POA: Diagnosis not present

## 2012-12-23 DIAGNOSIS — L821 Other seborrheic keratosis: Secondary | ICD-10-CM | POA: Diagnosis not present

## 2012-12-23 DIAGNOSIS — L723 Sebaceous cyst: Secondary | ICD-10-CM | POA: Diagnosis not present

## 2012-12-31 DIAGNOSIS — R972 Elevated prostate specific antigen [PSA]: Secondary | ICD-10-CM | POA: Diagnosis not present

## 2013-01-07 DIAGNOSIS — R972 Elevated prostate specific antigen [PSA]: Secondary | ICD-10-CM | POA: Diagnosis not present

## 2013-01-07 DIAGNOSIS — N401 Enlarged prostate with lower urinary tract symptoms: Secondary | ICD-10-CM | POA: Diagnosis not present

## 2013-01-27 DIAGNOSIS — H2510 Age-related nuclear cataract, unspecified eye: Secondary | ICD-10-CM | POA: Diagnosis not present

## 2013-01-27 DIAGNOSIS — H40009 Preglaucoma, unspecified, unspecified eye: Secondary | ICD-10-CM | POA: Diagnosis not present

## 2013-02-25 DIAGNOSIS — R05 Cough: Secondary | ICD-10-CM | POA: Diagnosis not present

## 2013-02-25 DIAGNOSIS — F411 Generalized anxiety disorder: Secondary | ICD-10-CM | POA: Diagnosis not present

## 2013-02-25 DIAGNOSIS — Z23 Encounter for immunization: Secondary | ICD-10-CM | POA: Diagnosis not present

## 2013-02-25 DIAGNOSIS — R0602 Shortness of breath: Secondary | ICD-10-CM | POA: Diagnosis not present

## 2013-02-25 DIAGNOSIS — F17200 Nicotine dependence, unspecified, uncomplicated: Secondary | ICD-10-CM | POA: Diagnosis not present

## 2013-02-25 DIAGNOSIS — Z0000 Encounter for general adult medical examination without abnormal findings: Secondary | ICD-10-CM | POA: Diagnosis not present

## 2013-06-12 DIAGNOSIS — R141 Gas pain: Secondary | ICD-10-CM | POA: Diagnosis not present

## 2013-06-12 DIAGNOSIS — L02219 Cutaneous abscess of trunk, unspecified: Secondary | ICD-10-CM | POA: Diagnosis not present

## 2013-06-12 DIAGNOSIS — R143 Flatulence: Secondary | ICD-10-CM | POA: Diagnosis not present

## 2013-06-12 DIAGNOSIS — R142 Eructation: Secondary | ICD-10-CM | POA: Diagnosis not present

## 2013-09-09 DIAGNOSIS — E7800 Pure hypercholesterolemia, unspecified: Secondary | ICD-10-CM | POA: Diagnosis not present

## 2013-09-09 DIAGNOSIS — F411 Generalized anxiety disorder: Secondary | ICD-10-CM | POA: Diagnosis not present

## 2013-09-29 DIAGNOSIS — Z23 Encounter for immunization: Secondary | ICD-10-CM | POA: Diagnosis not present

## 2013-09-29 DIAGNOSIS — R0602 Shortness of breath: Secondary | ICD-10-CM | POA: Diagnosis not present

## 2013-11-15 ENCOUNTER — Other Ambulatory Visit: Payer: Self-pay | Admitting: Family Medicine

## 2013-11-20 ENCOUNTER — Ambulatory Visit (HOSPITAL_COMMUNITY)
Admission: RE | Admit: 2013-11-20 | Discharge: 2013-11-20 | Disposition: A | Discharge status: Home / Self Care | Payer: Medicare Other | Source: Ambulatory Visit | Attending: Family Medicine | Admitting: Family Medicine

## 2013-11-20 MED ORDER — LIDOCAINE HCL (PF) 1 % IJ SOLN
INTRAMUSCULAR | Status: AC
  Filled 2013-11-20: qty 2

## 2013-11-20 MED ORDER — LIDOCAINE HCL (PF) 1 % IJ SOLN
2.0000 mL | INTRAMUSCULAR | Status: DC
  Administered 2013-11-20: 2 mL via INTRADERMAL

## 2013-11-20 NOTE — Progress Notes (Signed)
Left Ac injected with lidocaine per md order and pt request then phlebotomized 500cc and pt tolerated procedure well.

## 2013-11-26 ENCOUNTER — Other Ambulatory Visit (HOSPITAL_COMMUNITY): Payer: Self-pay | Admitting: *Deleted

## 2013-11-27 ENCOUNTER — Encounter (HOSPITAL_COMMUNITY)
Admission: RE | Admit: 2013-11-27 | Discharge: 2013-11-27 | Disposition: A | Discharge status: Home / Self Care | Payer: Medicare Other | Source: Ambulatory Visit | Attending: Family Medicine | Admitting: Family Medicine

## 2013-11-27 LAB — POCT HEMOGLOBIN-HEMACUE: Hemoglobin: 14.8 g/dL (ref 13.0–17.0)

## 2013-11-27 MED ORDER — LIDOCAINE HCL (PF) 1 % IJ SOLN
2.0000 mL | INTRAMUSCULAR | Status: DC
  Administered 2013-11-27: 0.2 mL via INTRADERMAL

## 2013-11-27 MED ORDER — LIDOCAINE HCL (PF) 1 % IJ SOLN
INTRAMUSCULAR | Status: AC
  Administered 2013-11-27: 0.2 mL via INTRADERMAL
  Filled 2013-11-27: qty 2

## 2013-11-27 NOTE — Progress Notes (Signed)
HGB 14.9 via hemocure.  Therapeutic phlebotomy performed without difficulty.  Pt tolerated procedure with no c/o's

## 2013-12-31 DIAGNOSIS — R972 Elevated prostate specific antigen [PSA]: Secondary | ICD-10-CM | POA: Diagnosis not present

## 2014-01-07 DIAGNOSIS — N401 Enlarged prostate with lower urinary tract symptoms: Secondary | ICD-10-CM | POA: Diagnosis not present

## 2014-01-07 DIAGNOSIS — R972 Elevated prostate specific antigen [PSA]: Secondary | ICD-10-CM | POA: Diagnosis not present

## 2014-01-14 DIAGNOSIS — D239 Other benign neoplasm of skin, unspecified: Secondary | ICD-10-CM | POA: Diagnosis not present

## 2014-01-14 DIAGNOSIS — L723 Sebaceous cyst: Secondary | ICD-10-CM | POA: Diagnosis not present

## 2014-01-14 DIAGNOSIS — L821 Other seborrheic keratosis: Secondary | ICD-10-CM | POA: Diagnosis not present

## 2014-01-20 DIAGNOSIS — Z23 Encounter for immunization: Secondary | ICD-10-CM | POA: Diagnosis not present

## 2014-02-11 DIAGNOSIS — H40013 Open angle with borderline findings, low risk, bilateral: Secondary | ICD-10-CM | POA: Diagnosis not present

## 2014-02-11 DIAGNOSIS — H2513 Age-related nuclear cataract, bilateral: Secondary | ICD-10-CM | POA: Diagnosis not present

## 2014-05-11 ENCOUNTER — Other Ambulatory Visit: Payer: Self-pay | Admitting: Family Medicine

## 2014-05-20 ENCOUNTER — Encounter (HOSPITAL_COMMUNITY)
Admission: RE | Admit: 2014-05-20 | Discharge: 2014-05-20 | Disposition: A | Discharge status: Home / Self Care | Payer: Medicare Other | Source: Ambulatory Visit | Attending: Family Medicine | Admitting: Family Medicine

## 2014-05-20 DIAGNOSIS — Z5181 Encounter for therapeutic drug level monitoring: Secondary | ICD-10-CM | POA: Diagnosis not present

## 2014-05-20 DIAGNOSIS — Z79899 Other long term (current) drug therapy: Secondary | ICD-10-CM | POA: Diagnosis not present

## 2014-05-20 MED ORDER — LIDOCAINE HCL (PF) 2 % IJ SOLN
INTRAMUSCULAR | Status: AC
  Filled 2014-05-20: qty 10

## 2014-05-20 NOTE — Progress Notes (Signed)
PT came in today for scheduled theraputic phlebotomy.  PTs  hemocue was 16.0 today prior to procedure. Lidocaine was used.  Right AC was used.  500cc of blood was removed.  Pt tolerated procedure well.  Will continue to monitor closely

## 2014-05-21 LAB — POCT HEMOGLOBIN-HEMACUE: Hemoglobin: 16 g/dL (ref 13.0–17.0)

## 2014-05-24 DIAGNOSIS — Z1389 Encounter for screening for other disorder: Secondary | ICD-10-CM | POA: Diagnosis not present

## 2014-05-24 DIAGNOSIS — J449 Chronic obstructive pulmonary disease, unspecified: Secondary | ICD-10-CM | POA: Diagnosis not present

## 2014-05-24 DIAGNOSIS — F411 Generalized anxiety disorder: Secondary | ICD-10-CM | POA: Diagnosis not present

## 2014-05-24 DIAGNOSIS — E780 Pure hypercholesterolemia: Secondary | ICD-10-CM | POA: Diagnosis not present

## 2014-07-22 DIAGNOSIS — L821 Other seborrheic keratosis: Secondary | ICD-10-CM | POA: Diagnosis not present

## 2014-07-22 DIAGNOSIS — L570 Actinic keratosis: Secondary | ICD-10-CM | POA: Diagnosis not present

## 2014-07-22 DIAGNOSIS — D2372 Other benign neoplasm of skin of left lower limb, including hip: Secondary | ICD-10-CM | POA: Diagnosis not present

## 2014-07-22 DIAGNOSIS — D225 Melanocytic nevi of trunk: Secondary | ICD-10-CM | POA: Diagnosis not present

## 2014-08-18 DIAGNOSIS — M545 Low back pain: Secondary | ICD-10-CM | POA: Diagnosis not present

## 2014-08-18 DIAGNOSIS — M109 Gout, unspecified: Secondary | ICD-10-CM | POA: Diagnosis not present

## 2014-08-18 DIAGNOSIS — F17210 Nicotine dependence, cigarettes, uncomplicated: Secondary | ICD-10-CM | POA: Diagnosis not present

## 2014-08-18 DIAGNOSIS — Z1211 Encounter for screening for malignant neoplasm of colon: Secondary | ICD-10-CM | POA: Diagnosis not present

## 2014-08-24 DIAGNOSIS — Z1211 Encounter for screening for malignant neoplasm of colon: Secondary | ICD-10-CM | POA: Diagnosis not present

## 2014-10-20 DIAGNOSIS — E781 Pure hyperglyceridemia: Secondary | ICD-10-CM | POA: Diagnosis not present

## 2014-10-20 DIAGNOSIS — F17210 Nicotine dependence, cigarettes, uncomplicated: Secondary | ICD-10-CM | POA: Diagnosis not present

## 2014-10-20 DIAGNOSIS — M109 Gout, unspecified: Secondary | ICD-10-CM | POA: Diagnosis not present

## 2014-10-20 DIAGNOSIS — R030 Elevated blood-pressure reading, without diagnosis of hypertension: Secondary | ICD-10-CM | POA: Diagnosis not present

## 2014-10-20 DIAGNOSIS — E780 Pure hypercholesterolemia: Secondary | ICD-10-CM | POA: Diagnosis not present

## 2014-10-28 ENCOUNTER — Encounter (HOSPITAL_COMMUNITY)
Admission: RE | Admit: 2014-10-28 | Discharge: 2014-10-28 | Disposition: A | Discharge status: Home / Self Care | Payer: Medicare Other | Source: Ambulatory Visit | Attending: Family Medicine | Admitting: Family Medicine

## 2014-10-28 LAB — POCT HEMOGLOBIN-HEMACUE: HEMOGLOBIN: 14 g/dL (ref 13.0–17.0)

## 2014-10-28 MED ORDER — LIDOCAINE HCL (PF) 1 % IJ SOLN
INTRAMUSCULAR | Status: AC
  Filled 2014-10-28: qty 2

## 2014-10-28 MED ORDER — LIDOCAINE HCL 2 % IJ SOLN: 0.1000 mL | INTRAMUSCULAR | Status: DC

## 2014-10-28 NOTE — Progress Notes (Signed)
500 ml theraputic phlebotomy complete using right antecub. Per protocol; pt tolerated well

## 2014-12-08 DIAGNOSIS — E780 Pure hypercholesterolemia: Secondary | ICD-10-CM | POA: Diagnosis not present

## 2014-12-08 DIAGNOSIS — E782 Mixed hyperlipidemia: Secondary | ICD-10-CM | POA: Diagnosis not present

## 2014-12-08 DIAGNOSIS — M109 Gout, unspecified: Secondary | ICD-10-CM | POA: Diagnosis not present

## 2014-12-16 ENCOUNTER — Other Ambulatory Visit (HOSPITAL_COMMUNITY): Payer: Self-pay | Admitting: *Deleted

## 2014-12-17 ENCOUNTER — Encounter (HOSPITAL_COMMUNITY)
Admission: RE | Admit: 2014-12-17 | Discharge: 2014-12-17 | Disposition: A | Discharge status: Home / Self Care | Payer: Medicare Other | Source: Ambulatory Visit | Attending: Family Medicine | Admitting: Family Medicine

## 2014-12-17 MED ORDER — LIDOCAINE HCL (PF) 1 % IJ SOLN
2.0000 mL | Freq: Once | INTRAMUSCULAR | Status: AC
  Administered 2014-12-17: 2 mL via INTRADERMAL

## 2014-12-17 MED ORDER — LIDOCAINE HCL (PF) 1 % IJ SOLN
INTRAMUSCULAR | Status: AC
  Filled 2014-12-17: qty 2

## 2014-12-17 NOTE — Progress Notes (Signed)
500 ml Phlebotomy complete using right antecub; Hgb 15.6; pt tolerated well

## 2014-12-20 LAB — POCT HEMOGLOBIN-HEMACUE: Hemoglobin: 15.6 g/dL (ref 13.0–17.0)

## 2014-12-24 ENCOUNTER — Encounter (HOSPITAL_COMMUNITY)
Admission: RE | Admit: 2014-12-24 | Discharge: 2014-12-24 | Disposition: A | Discharge status: Home / Self Care | Payer: Medicare Other | Source: Ambulatory Visit | Attending: Family Medicine | Admitting: Family Medicine

## 2014-12-24 LAB — POCT HEMOGLOBIN-HEMACUE: Hemoglobin: 13.9 g/dL (ref 13.0–17.0)

## 2014-12-24 MED ORDER — LIDOCAINE HCL (PF) 1 % IJ SOLN
2.0000 mL | Freq: Once | INTRAMUSCULAR | Status: AC
  Administered 2014-12-24: 2 mL

## 2014-12-24 MED ORDER — LIDOCAINE HCL (PF) 1 % IJ SOLN
INTRAMUSCULAR | Status: AC
  Filled 2014-12-24: qty 2

## 2014-12-24 NOTE — Progress Notes (Signed)
Pt came in today for scheduled and therapeutic phlebotomy.  500 cc was removed per MD order and per protocol.  Pt's HemoCue prior to procedure was 13.9.  Lidocaine was used.  Left AC was accessed.  PT tolerated procedure well.  Will continue to monitor

## 2015-01-05 DIAGNOSIS — R972 Elevated prostate specific antigen [PSA]: Secondary | ICD-10-CM | POA: Diagnosis not present

## 2015-01-12 DIAGNOSIS — R972 Elevated prostate specific antigen [PSA]: Secondary | ICD-10-CM | POA: Diagnosis not present

## 2015-01-12 DIAGNOSIS — N401 Enlarged prostate with lower urinary tract symptoms: Secondary | ICD-10-CM | POA: Diagnosis not present

## 2015-01-12 DIAGNOSIS — N138 Other obstructive and reflux uropathy: Secondary | ICD-10-CM | POA: Diagnosis not present

## 2015-01-12 DIAGNOSIS — R351 Nocturia: Secondary | ICD-10-CM | POA: Diagnosis not present

## 2015-01-18 DIAGNOSIS — Z23 Encounter for immunization: Secondary | ICD-10-CM | POA: Diagnosis not present

## 2015-02-16 DIAGNOSIS — L821 Other seborrheic keratosis: Secondary | ICD-10-CM | POA: Diagnosis not present

## 2015-02-16 DIAGNOSIS — Z86018 Personal history of other benign neoplasm: Secondary | ICD-10-CM | POA: Diagnosis not present

## 2015-02-16 DIAGNOSIS — L820 Inflamed seborrheic keratosis: Secondary | ICD-10-CM | POA: Diagnosis not present

## 2015-02-16 DIAGNOSIS — D2272 Melanocytic nevi of left lower limb, including hip: Secondary | ICD-10-CM | POA: Diagnosis not present

## 2015-02-16 DIAGNOSIS — D225 Melanocytic nevi of trunk: Secondary | ICD-10-CM | POA: Diagnosis not present

## 2015-02-23 DIAGNOSIS — H5203 Hypermetropia, bilateral: Secondary | ICD-10-CM | POA: Diagnosis not present

## 2015-02-23 DIAGNOSIS — H2513 Age-related nuclear cataract, bilateral: Secondary | ICD-10-CM | POA: Diagnosis not present

## 2015-03-16 DIAGNOSIS — M545 Low back pain: Secondary | ICD-10-CM | POA: Diagnosis not present

## 2015-03-16 DIAGNOSIS — K5900 Constipation, unspecified: Secondary | ICD-10-CM | POA: Diagnosis not present

## 2015-03-16 DIAGNOSIS — R110 Nausea: Secondary | ICD-10-CM | POA: Diagnosis not present

## 2015-03-26 ENCOUNTER — Emergency Department (HOSPITAL_COMMUNITY): Payer: Medicare Other

## 2015-03-26 ENCOUNTER — Encounter (HOSPITAL_COMMUNITY): Payer: Self-pay | Admitting: Emergency Medicine

## 2015-03-26 ENCOUNTER — Emergency Department (HOSPITAL_COMMUNITY)
Admission: EM | Admit: 2015-03-26 | Discharge: 2015-03-26 | Disposition: A | Discharge status: Home / Self Care | Payer: Medicare Other | Attending: Emergency Medicine | Admitting: Emergency Medicine

## 2015-03-26 DIAGNOSIS — F419 Anxiety disorder, unspecified: Secondary | ICD-10-CM | POA: Insufficient documentation

## 2015-03-26 DIAGNOSIS — F17200 Nicotine dependence, unspecified, uncomplicated: Secondary | ICD-10-CM | POA: Insufficient documentation

## 2015-03-26 DIAGNOSIS — R531 Weakness: Secondary | ICD-10-CM | POA: Diagnosis not present

## 2015-03-26 DIAGNOSIS — R634 Abnormal weight loss: Secondary | ICD-10-CM | POA: Insufficient documentation

## 2015-03-26 DIAGNOSIS — Z79899 Other long term (current) drug therapy: Secondary | ICD-10-CM | POA: Insufficient documentation

## 2015-03-26 DIAGNOSIS — Z8709 Personal history of other diseases of the respiratory system: Secondary | ICD-10-CM | POA: Diagnosis not present

## 2015-03-26 DIAGNOSIS — M25551 Pain in right hip: Secondary | ICD-10-CM | POA: Insufficient documentation

## 2015-03-26 DIAGNOSIS — Z791 Long term (current) use of non-steroidal anti-inflammatories (NSAID): Secondary | ICD-10-CM | POA: Diagnosis not present

## 2015-03-26 DIAGNOSIS — Z7982 Long term (current) use of aspirin: Secondary | ICD-10-CM | POA: Insufficient documentation

## 2015-03-26 DIAGNOSIS — R1031 Right lower quadrant pain: Secondary | ICD-10-CM | POA: Diagnosis not present

## 2015-03-26 DIAGNOSIS — M549 Dorsalgia, unspecified: Secondary | ICD-10-CM | POA: Insufficient documentation

## 2015-03-26 DIAGNOSIS — M545 Low back pain: Secondary | ICD-10-CM | POA: Diagnosis not present

## 2015-03-26 DIAGNOSIS — I2510 Atherosclerotic heart disease of native coronary artery without angina pectoris: Secondary | ICD-10-CM | POA: Diagnosis not present

## 2015-03-26 DIAGNOSIS — R5383 Other fatigue: Secondary | ICD-10-CM | POA: Diagnosis present

## 2015-03-26 HISTORY — DX: Bronchitis, not specified as acute or chronic: J40

## 2015-03-26 LAB — CBC WITH DIFFERENTIAL/PLATELET
Basophils Absolute: 0 10*3/uL (ref 0.0–0.1)
Basophils Relative: 1 %
Eosinophils Absolute: 0.1 10*3/uL (ref 0.0–0.7)
Eosinophils Relative: 2 %
HCT: 51.6 % (ref 39.0–52.0)
Hemoglobin: 16.7 g/dL (ref 13.0–17.0)
Lymphocytes Relative: 21 %
Lymphs Abs: 1.2 10*3/uL (ref 0.7–4.0)
MCH: 32.7 pg (ref 26.0–34.0)
MCHC: 32.4 g/dL (ref 30.0–36.0)
MCV: 101.2 fL — ABNORMAL HIGH (ref 78.0–100.0)
Monocytes Absolute: 0.4 10*3/uL (ref 0.1–1.0)
Monocytes Relative: 6 %
Neutro Abs: 4.3 10*3/uL (ref 1.7–7.7)
Neutrophils Relative %: 72 %
Platelets: 163 10*3/uL (ref 150–400)
RBC: 5.1 MIL/uL (ref 4.22–5.81)
RDW: 12.8 % (ref 11.5–15.5)
WBC: 6 10*3/uL (ref 4.0–10.5)

## 2015-03-26 LAB — URINALYSIS, ROUTINE W REFLEX MICROSCOPIC
BILIRUBIN URINE: NEGATIVE
GLUCOSE, UA: NEGATIVE mg/dL
HGB URINE DIPSTICK: NEGATIVE
Ketones, ur: NEGATIVE mg/dL
Leukocytes, UA: NEGATIVE
Nitrite: NEGATIVE
PROTEIN: NEGATIVE mg/dL
Specific Gravity, Urine: 1.021 (ref 1.005–1.030)
pH: 5.5 (ref 5.0–8.0)

## 2015-03-26 LAB — LIPASE, BLOOD: Lipase: 50 U/L (ref 11–51)

## 2015-03-26 LAB — COMPREHENSIVE METABOLIC PANEL
ALBUMIN: 4.4 g/dL (ref 3.5–5.0)
ALK PHOS: 100 U/L (ref 38–126)
ALT: 38 U/L (ref 17–63)
AST: 47 U/L — AB (ref 15–41)
Anion gap: 9 (ref 5–15)
BILIRUBIN TOTAL: 1.3 mg/dL — AB (ref 0.3–1.2)
BUN: 16 mg/dL (ref 6–20)
CALCIUM: 9.2 mg/dL (ref 8.9–10.3)
CO2: 27 mmol/L (ref 22–32)
Chloride: 103 mmol/L (ref 101–111)
Creatinine, Ser: 0.97 mg/dL (ref 0.61–1.24)
GFR calc Af Amer: >60 mL/min (ref >60)
GFR calc non Af Amer: >60 mL/min (ref >60)
GLUCOSE: 145 mg/dL — AB (ref 65–99)
POTASSIUM: 3.8 mmol/L (ref 3.5–5.1)
SODIUM: 139 mmol/L (ref 135–145)
Total Protein: 7.6 g/dL (ref 6.5–8.1)

## 2015-03-26 MED ORDER — ONDANSETRON HCL 4 MG/2ML IJ SOLN
4.0000 mg | Freq: Once | INTRAMUSCULAR | Status: AC
  Administered 2015-03-26: 4 mg via INTRAVENOUS
  Filled 2015-03-26: qty 2

## 2015-03-26 MED ORDER — HYDROCODONE-ACETAMINOPHEN 5-325 MG PO TABS: 1.0000 | ORAL_TABLET | ORAL | Status: AC | PRN

## 2015-03-26 MED ORDER — IOHEXOL 300 MG/ML  SOLN
50.0000 mL | Freq: Once | INTRAMUSCULAR | Status: AC | PRN
Start: 2015-03-26 — End: 2015-03-26
  Administered 2015-03-26: 50 mL via ORAL

## 2015-03-26 MED ORDER — IOHEXOL 300 MG/ML  SOLN
100.0000 mL | Freq: Once | INTRAMUSCULAR | Status: AC | PRN
  Administered 2015-03-26: 100 mL via INTRAVENOUS

## 2015-03-26 MED ORDER — SODIUM CHLORIDE 0.9 % IV BOLUS (SEPSIS)
1000.0000 mL | Freq: Once | INTRAVENOUS | Status: AC
  Administered 2015-03-26: 1000 mL via INTRAVENOUS

## 2015-03-26 NOTE — ED Notes (Signed)
Pt comes to ED, right side back pain reports 10 out of 10, and reports weight loss of more than 10 pounds. Provider in the room

## 2015-03-26 NOTE — ED Provider Notes (Signed)
CSN: [id number]     Arrival date & time 03/26/15  [id number] History   First MD Initiated Contact with Patient 03/26/15 [id number]     Chief Complaint  Patient presents with  . Back Pain  . Weight Loss     (Consider location/radiation/quality/duration/timing/severity/associated sxs/prior Treatment) HPI Patient presents with concern of fatigue, weight loss, right-sided pain. Symptoms began about one month ago. Since onset pain has been focally in the right lateral area, just superior to the hip. There is mild associated nausea, no vomiting, no no diarrhea, no bloody stool. Patient has been taking Celebrex for pain control, with minimal change. No new fever, chills. Patient has a notable history of smoking, enlarged prostate. Last colonoscopy was 2 years ago, with multiple polyps found.  Past Medical History  Diagnosis Date  . Coronary artery disease   . Anxiety   . Bronchitis    Past Surgical History  Procedure Laterality Date  . Hernia repair     History reviewed. No pertinent family history. Social History  Substance Use Topics  . Smoking status: Current Every Day Smoker  . Smokeless tobacco: None  . Alcohol Use: Yes     Comment: 6 beers per day    Review of Systems  Constitutional:       Per HPI, otherwise negative  HENT:       Per HPI, otherwise negative  Respiratory:       Per HPI, otherwise negative  Cardiovascular:       Per HPI, otherwise negative  Gastrointestinal: Negative for vomiting.  Endocrine:       Negative aside from HPI  Genitourinary:       Neg aside from HPI   Musculoskeletal:       Per HPI, otherwise negative  Skin: Negative.   Neurological: Positive for weakness. Negative for syncope.      Allergies  Other  Home Medications   Prior to Admission medications   Medication Sig Start Date End Date Taking? Authorizing Provider  albuterol (PROVENTIL HFA;VENTOLIN HFA) 108 (90 BASE) MCG/ACT inhaler Inhale 2 puffs into the lungs every 6 (six) hours  as needed for wheezing or shortness of breath.   Yes Historical Provider, MD  ALPRAZolam Duanne Moron) 0.5 MG tablet Take 0.5 mg by mouth daily.   Yes Historical Provider, MD  aspirin EC 81 MG tablet Take 81 mg by mouth daily.   Yes Historical Provider, MD  cyclobenzaprine (FLEXERIL) 10 MG tablet Take 10 mg by mouth 3 (three) times daily as needed for muscle spasms.   Yes Historical Provider, MD  meloxicam (MOBIC) 15 MG tablet Take 15 mg by mouth daily. 03/16/15  Yes Historical Provider, MD  naproxen sodium (ANAPROX) 220 MG tablet Take 220 mg by mouth 2 (two) times daily with a meal.   Yes Historical Provider, MD  omega-3 acid ethyl esters (LOVAZA) 1 G capsule Take 1 g by mouth 2 (two) times daily.   Yes Historical Provider, MD  rosuvastatin (CRESTOR) 10 MG tablet Take 10 mg by mouth daily.   Yes Historical Provider, MD   BP 171/83 mmHg  Pulse 63  Temp(Src) 98.2 F (36.8 C) (Oral)  Resp 19  Ht 5\' 6"  (1.676 m)  Wt 149 lb (67.586 kg)  BMI 24.06 kg/m2  SpO2 100% Physical Exam  Constitutional: He is oriented to person, place, and time. He appears well-developed. No distress.  Heavy odor of cigarette smoke present  HENT:  Head: Normocephalic and atraumatic.  Eyes: Conjunctivae and EOM are normal.  Cardiovascular: Normal rate and regular rhythm.   Pulmonary/Chest: Effort normal. No stridor. No respiratory distress. He has decreased breath sounds.  Abdominal: He exhibits no distension. There is no tenderness. There is no rebound and no guarding.  Musculoskeletal: He exhibits no edema.       Legs: Neurological: He is alert and oriented to person, place, and time.  Skin: Skin is warm and dry.  Psychiatric: He has a normal mood and affect.  Nursing note and vitals reviewed.   ED Course  Procedures (including critical care time) Labs Review Labs Reviewed  COMPREHENSIVE METABOLIC PANEL - Abnormal; Notable for the following:    Glucose, Bld 145 (*)    AST 47 (*)    Total Bilirubin 1.3 (*)     All other components within normal limits  CBC WITH DIFFERENTIAL/PLATELET - Abnormal; Notable for the following:    MCV 101.2 (*)    All other components within normal limits  URINALYSIS, ROUTINE W REFLEX MICROSCOPIC (NOT AT [facility]) - Abnormal; Notable for the following:    Color, Urine AMBER (*)    All other components within normal limits  LIPASE, BLOOD    Imaging Review Dg Chest 2 View  03/26/2015  CLINICAL DATA:  Lower back pain.  Weight loss, nausea, smoker. EXAM: CHEST  2 VIEW COMPARISON:  01/22/2012 FINDINGS: Normal heart size and mediastinal contours. Mild hyperinflation. No acute infiltrate or edema. No effusion or pneumothorax. No acute osseous findings. IMPRESSION: 1. No evidence of acute disease. 2. Mild hyperinflation, possible COPD. Electronically Signed   By: Monte Fantasia M.D.   On: 03/26/2015 10:17   Dg Lumbar Spine 2-3 Views  03/26/2015  CLINICAL DATA:  Right-sided back pain for 1 month. Weight loss. No reported acute injury. Initial encounter. EXAM: LUMBAR SPINE - 2-3 VIEW COMPARISON:  None. FINDINGS: There are 5 lumbar type vertebral bodies. There is a minimal convex left scoliosis. The lateral alignment is normal. The disc spaces are preserved, although there is mild intervertebral spurring and facet hypertrophy throughout the lumbar spine. No evidence of pneumothorax or acute fracture. Extensive aortoiliac atherosclerosis noted. IMPRESSION: No acute findings or significant malalignment. Mild spondylosis and extensive atherosclerosis noted. Electronically Signed   By: Richardean Sale M.D.   On: 03/26/2015 09:57   Ct Abdomen Pelvis W Contrast  03/26/2015  CLINICAL DATA:  Right-sided back pain, weight loss of greater than 10 pounds. EXAM: CT ABDOMEN AND PELVIS WITH CONTRAST TECHNIQUE: Multidetector CT imaging of the abdomen and pelvis was performed using the standard protocol following bolus administration of intravenous contrast. CONTRAST:  8mL OMNIPAQUE IOHEXOL 300 MG/ML SOLN,  167mL OMNIPAQUE IOHEXOL 300 MG/ML SOLN COMPARISON:  None. FINDINGS: Lower chest:  No acute findings. Hepatobiliary: 8 mm hypodense lesion within the left hepatic lobe, too small to definitively characterize but probably a small cyst. Liver otherwise normal. Gallbladder appears normal. No bile duct dilatation. Pancreas: No mass, inflammatory changes, or other significant abnormality. Spleen: Within normal limits in size and appearance. Adrenals/Urinary Tract: Adrenal glands appear normal. Kidneys appear normal without mass, cyst, stone or hydronephrosis. No ureteral or bladder calculi identified. Stomach/Bowel: Bowel is normal in caliber. No bowel wall thickening or evidence of bowel wall inflammation seen. Scattered diverticulosis noted within the descending and sigmoid colon but no focal inflammatory change to suggest acute diverticulitis. Appendix is not convincingly seen but there are no inflammatory changes about the cecum to suggest acute appendicitis. Vascular/Lymphatic: Scattered atherosclerotic changes of the normal-caliber abdominal aorta. Additional atherosclerotic changes throughout the pelvic  vasculature. No acute vascular abnormality seen. No enlarged lymph nodes identified within the abdomen or pelvis. Reproductive: Prostate gland is moderately enlarged causing slight mass effect on the bladder base. Otherwise unremarkable. Other: No free fluid or abscess collections seen. No soft tissue mass identified in the abdomen or pelvis. No free intraperitoneal air. Musculoskeletal: Mild degenerative change throughout the slightly scoliotic thoracolumbar spine but no acute osseous abnormality. Superficial soft tissues are unremarkable. IMPRESSION: 1. No evidence of acute intra-abdominal or intrapelvic abnormality. No evidence of neoplastic process within the abdomen or pelvis. 2. Colonic diverticulosis without evidence of acute diverticulitis. 3. Fairly prominent atherosclerotic changes throughout the abdominal  aorta and pelvic vasculature. 4. Moderate prostate gland enlargement. Recommend correlation with physical exam findings and/or PSA lab values. Electronically Signed   By: Franki Cabot M.D.   On: 03/26/2015 11:40   I have personally reviewed and evaluated these images and lab results as part of my medical decision-making.  Chart review notable for colonoscopy 2 years ago, with multiple polyps, and subsequent hemorrhage.   12:47 PM On repeat exam the patient is in no distress. We discussed all findings, including possible hepatic cyst, enlarged prostate. With stable vital signs, the patient will follow up with outpatient providers. MDM  Patient presents with new right sided pain. Patient also has weight loss, and given his smoking history, malignancy is a concern. CT scan, chest x-ray did not demonstrate obvious malignancy, though with weight loss, there is concern for occult issues. Patient has outpatient providers, both urology and gastroenterology to continue the evaluation. Patient received analgesia 6, was discharged in stable condition.  Carmin Muskrat, MD 03/26/15 1248

## 2015-03-26 NOTE — Discharge Instructions (Signed)
As discussed, it is important that you follow up with your physicians for continued management of your condition. ° °If you develop any new, or concerning changes in your condition, please return to the emergency department immediately. ° ° ° °

## 2015-03-26 NOTE — ED Notes (Signed)
Pt w/ CT

## 2015-03-29 DIAGNOSIS — R972 Elevated prostate specific antigen [PSA]: Secondary | ICD-10-CM | POA: Diagnosis not present

## 2015-03-31 DIAGNOSIS — E291 Testicular hypofunction: Secondary | ICD-10-CM | POA: Diagnosis not present

## 2015-03-31 DIAGNOSIS — R972 Elevated prostate specific antigen [PSA]: Secondary | ICD-10-CM | POA: Diagnosis not present

## 2015-04-04 DIAGNOSIS — K5900 Constipation, unspecified: Secondary | ICD-10-CM | POA: Diagnosis not present

## 2015-04-04 DIAGNOSIS — R109 Unspecified abdominal pain: Secondary | ICD-10-CM | POA: Diagnosis not present

## 2015-04-04 DIAGNOSIS — Z86010 Personal history of colonic polyps: Secondary | ICD-10-CM | POA: Diagnosis not present

## 2015-04-29 DIAGNOSIS — Z1389 Encounter for screening for other disorder: Secondary | ICD-10-CM | POA: Diagnosis not present

## 2015-04-29 DIAGNOSIS — F17210 Nicotine dependence, cigarettes, uncomplicated: Secondary | ICD-10-CM | POA: Diagnosis not present

## 2015-04-29 DIAGNOSIS — M109 Gout, unspecified: Secondary | ICD-10-CM | POA: Diagnosis not present

## 2015-04-29 DIAGNOSIS — E782 Mixed hyperlipidemia: Secondary | ICD-10-CM | POA: Diagnosis not present

## 2015-04-29 DIAGNOSIS — F411 Generalized anxiety disorder: Secondary | ICD-10-CM | POA: Diagnosis not present

## 2015-04-29 DIAGNOSIS — M5030 Other cervical disc degeneration, unspecified cervical region: Secondary | ICD-10-CM | POA: Diagnosis not present

## 2015-05-16 DIAGNOSIS — B370 Candidal stomatitis: Secondary | ICD-10-CM | POA: Diagnosis not present

## 2015-05-16 DIAGNOSIS — K143 Hypertrophy of tongue papillae: Secondary | ICD-10-CM | POA: Diagnosis not present

## 2015-06-21 DIAGNOSIS — K143 Hypertrophy of tongue papillae: Secondary | ICD-10-CM | POA: Diagnosis not present

## 2015-08-09 DIAGNOSIS — H2513 Age-related nuclear cataract, bilateral: Secondary | ICD-10-CM | POA: Diagnosis not present

## 2015-08-12 DIAGNOSIS — F411 Generalized anxiety disorder: Secondary | ICD-10-CM | POA: Diagnosis not present

## 2015-08-12 DIAGNOSIS — R5383 Other fatigue: Secondary | ICD-10-CM | POA: Diagnosis not present

## 2015-08-12 DIAGNOSIS — E782 Mixed hyperlipidemia: Secondary | ICD-10-CM | POA: Diagnosis not present

## 2015-09-12 DIAGNOSIS — M5030 Other cervical disc degeneration, unspecified cervical region: Secondary | ICD-10-CM | POA: Diagnosis not present

## 2015-09-26 DIAGNOSIS — R972 Elevated prostate specific antigen [PSA]: Secondary | ICD-10-CM | POA: Diagnosis not present

## 2015-10-27 DIAGNOSIS — E291 Testicular hypofunction: Secondary | ICD-10-CM | POA: Diagnosis not present

## 2015-10-27 DIAGNOSIS — R972 Elevated prostate specific antigen [PSA]: Secondary | ICD-10-CM | POA: Diagnosis not present

## 2015-10-28 DIAGNOSIS — E782 Mixed hyperlipidemia: Secondary | ICD-10-CM | POA: Diagnosis not present

## 2015-10-28 DIAGNOSIS — F411 Generalized anxiety disorder: Secondary | ICD-10-CM | POA: Diagnosis not present

## 2015-10-28 DIAGNOSIS — M5030 Other cervical disc degeneration, unspecified cervical region: Secondary | ICD-10-CM | POA: Diagnosis not present

## 2015-10-28 DIAGNOSIS — J449 Chronic obstructive pulmonary disease, unspecified: Secondary | ICD-10-CM | POA: Diagnosis not present

## 2015-10-28 DIAGNOSIS — Z1389 Encounter for screening for other disorder: Secondary | ICD-10-CM | POA: Diagnosis not present

## 2015-10-28 DIAGNOSIS — Z0000 Encounter for general adult medical examination without abnormal findings: Secondary | ICD-10-CM | POA: Diagnosis not present

## 2016-01-18 DIAGNOSIS — Z23 Encounter for immunization: Secondary | ICD-10-CM | POA: Diagnosis not present

## 2016-01-18 DIAGNOSIS — M5030 Other cervical disc degeneration, unspecified cervical region: Secondary | ICD-10-CM | POA: Diagnosis not present

## 2016-01-18 DIAGNOSIS — R972 Elevated prostate specific antigen [PSA]: Secondary | ICD-10-CM | POA: Diagnosis not present

## 2016-01-18 DIAGNOSIS — E782 Mixed hyperlipidemia: Secondary | ICD-10-CM | POA: Diagnosis not present

## 2016-01-24 ENCOUNTER — Other Ambulatory Visit (HOSPITAL_COMMUNITY): Payer: Self-pay | Admitting: *Deleted

## 2016-01-25 ENCOUNTER — Encounter (HOSPITAL_COMMUNITY)
Admission: RE | Admit: 2016-01-25 | Discharge: 2016-01-25 | Disposition: A | Discharge status: Home / Self Care | Payer: Medicare Other | Source: Ambulatory Visit | Attending: Family Medicine | Admitting: Family Medicine

## 2016-01-25 LAB — POCT HEMOGLOBIN-HEMACUE: HEMOGLOBIN: 18 g/dL — AB (ref 13.0–17.0)

## 2016-01-25 MED ORDER — LIDOCAINE HCL (PF) 1 % IJ SOLN
INTRAMUSCULAR | Status: AC
  Administered 2016-01-25: 10:00:00
  Filled 2016-01-25: qty 5

## 2016-01-25 MED ORDER — LIDOCAINE HCL 2 % IJ SOLN: 0.1000 mL | Freq: Once | INTRAMUSCULAR | Status: DC

## 2016-01-25 NOTE — Progress Notes (Signed)
Pt came in today for scheduled therapeutic Phlebotomy.  His HemoCue prior to procedure was 18.0.  Right AC was used. Lidocaine was used prior to phlebotomy.  500 cc of blood was removed per MD order.  Pt tolerated procedure well.  Will continue to monitor closely

## 2016-02-02 DIAGNOSIS — R972 Elevated prostate specific antigen [PSA]: Secondary | ICD-10-CM | POA: Diagnosis not present

## 2016-02-06 DIAGNOSIS — R972 Elevated prostate specific antigen [PSA]: Secondary | ICD-10-CM | POA: Diagnosis not present

## 2016-02-21 DIAGNOSIS — Z86018 Personal history of other benign neoplasm: Secondary | ICD-10-CM | POA: Diagnosis not present

## 2016-02-21 DIAGNOSIS — L219 Seborrheic dermatitis, unspecified: Secondary | ICD-10-CM | POA: Diagnosis not present

## 2016-02-21 DIAGNOSIS — D225 Melanocytic nevi of trunk: Secondary | ICD-10-CM | POA: Diagnosis not present

## 2016-02-21 DIAGNOSIS — D2272 Melanocytic nevi of left lower limb, including hip: Secondary | ICD-10-CM | POA: Diagnosis not present

## 2016-05-23 DIAGNOSIS — F17210 Nicotine dependence, cigarettes, uncomplicated: Secondary | ICD-10-CM | POA: Diagnosis not present

## 2016-05-23 DIAGNOSIS — F411 Generalized anxiety disorder: Secondary | ICD-10-CM | POA: Diagnosis not present

## 2016-05-23 DIAGNOSIS — E782 Mixed hyperlipidemia: Secondary | ICD-10-CM | POA: Diagnosis not present

## 2016-07-17 ENCOUNTER — Other Ambulatory Visit (HOSPITAL_COMMUNITY): Payer: Self-pay | Admitting: *Deleted

## 2016-07-18 ENCOUNTER — Encounter (HOSPITAL_COMMUNITY)
Admission: RE | Admit: 2016-07-18 | Discharge: 2016-07-18 | Disposition: A | Discharge status: Home / Self Care | Payer: Medicare Other | Source: Ambulatory Visit | Attending: Family Medicine | Admitting: Family Medicine

## 2016-07-18 LAB — POCT HEMOGLOBIN-HEMACUE: HEMOGLOBIN: 15.7 g/dL (ref 13.0–17.0)

## 2016-07-18 MED ORDER — LIDOCAINE HCL 2 % IJ SOLN: 0.1000 mL | INTRAMUSCULAR | Status: DC

## 2016-07-18 MED ORDER — LIDOCAINE HCL (PF) 2 % IJ SOLN
INTRAMUSCULAR | Status: AC
  Filled 2016-07-18: qty 10

## 2016-07-18 NOTE — Progress Notes (Signed)
hemocue 15.7 today.  Anette Guarneri RN phlebotomized 500cc from right arm and pt tolerated well.

## 2016-07-25 ENCOUNTER — Other Ambulatory Visit (HOSPITAL_COMMUNITY): Payer: Self-pay | Admitting: *Deleted

## 2016-07-26 ENCOUNTER — Encounter (HOSPITAL_COMMUNITY)
Admission: RE | Admit: 2016-07-26 | Discharge: 2016-07-26 | Disposition: A | Discharge status: Home / Self Care | Payer: Medicare Other | Source: Ambulatory Visit | Attending: Family Medicine | Admitting: Family Medicine

## 2016-07-26 LAB — POCT HEMOGLOBIN-HEMACUE: Hemoglobin: 15.1 g/dL (ref 13.0–17.0)

## 2016-07-26 MED ORDER — LIDOCAINE HCL (PF) 1 % IJ SOLN: 2.0000 mL | Freq: Once | INTRAMUSCULAR | Status: DC

## 2016-07-26 MED ORDER — LIDOCAINE HCL (PF) 1 % IJ SOLN
INTRAMUSCULAR | Status: AC
  Administered 2016-07-26: 2 mL via INTRADERMAL
  Filled 2016-07-26: qty 2

## 2016-07-26 NOTE — Progress Notes (Signed)
hemocue 15.1. Phlebotomized right antecubital 500 ml

## 2017-04-12 IMAGING — CR DG CHEST 2V
2 series · 2 of 2 positions shown · non-contrast
Comparison: 01/22/2012

CLINICAL DATA: Lower back pain.  Weight loss, nausea, smoker.

EXAM:
CHEST  2 VIEW

[w chest pa]
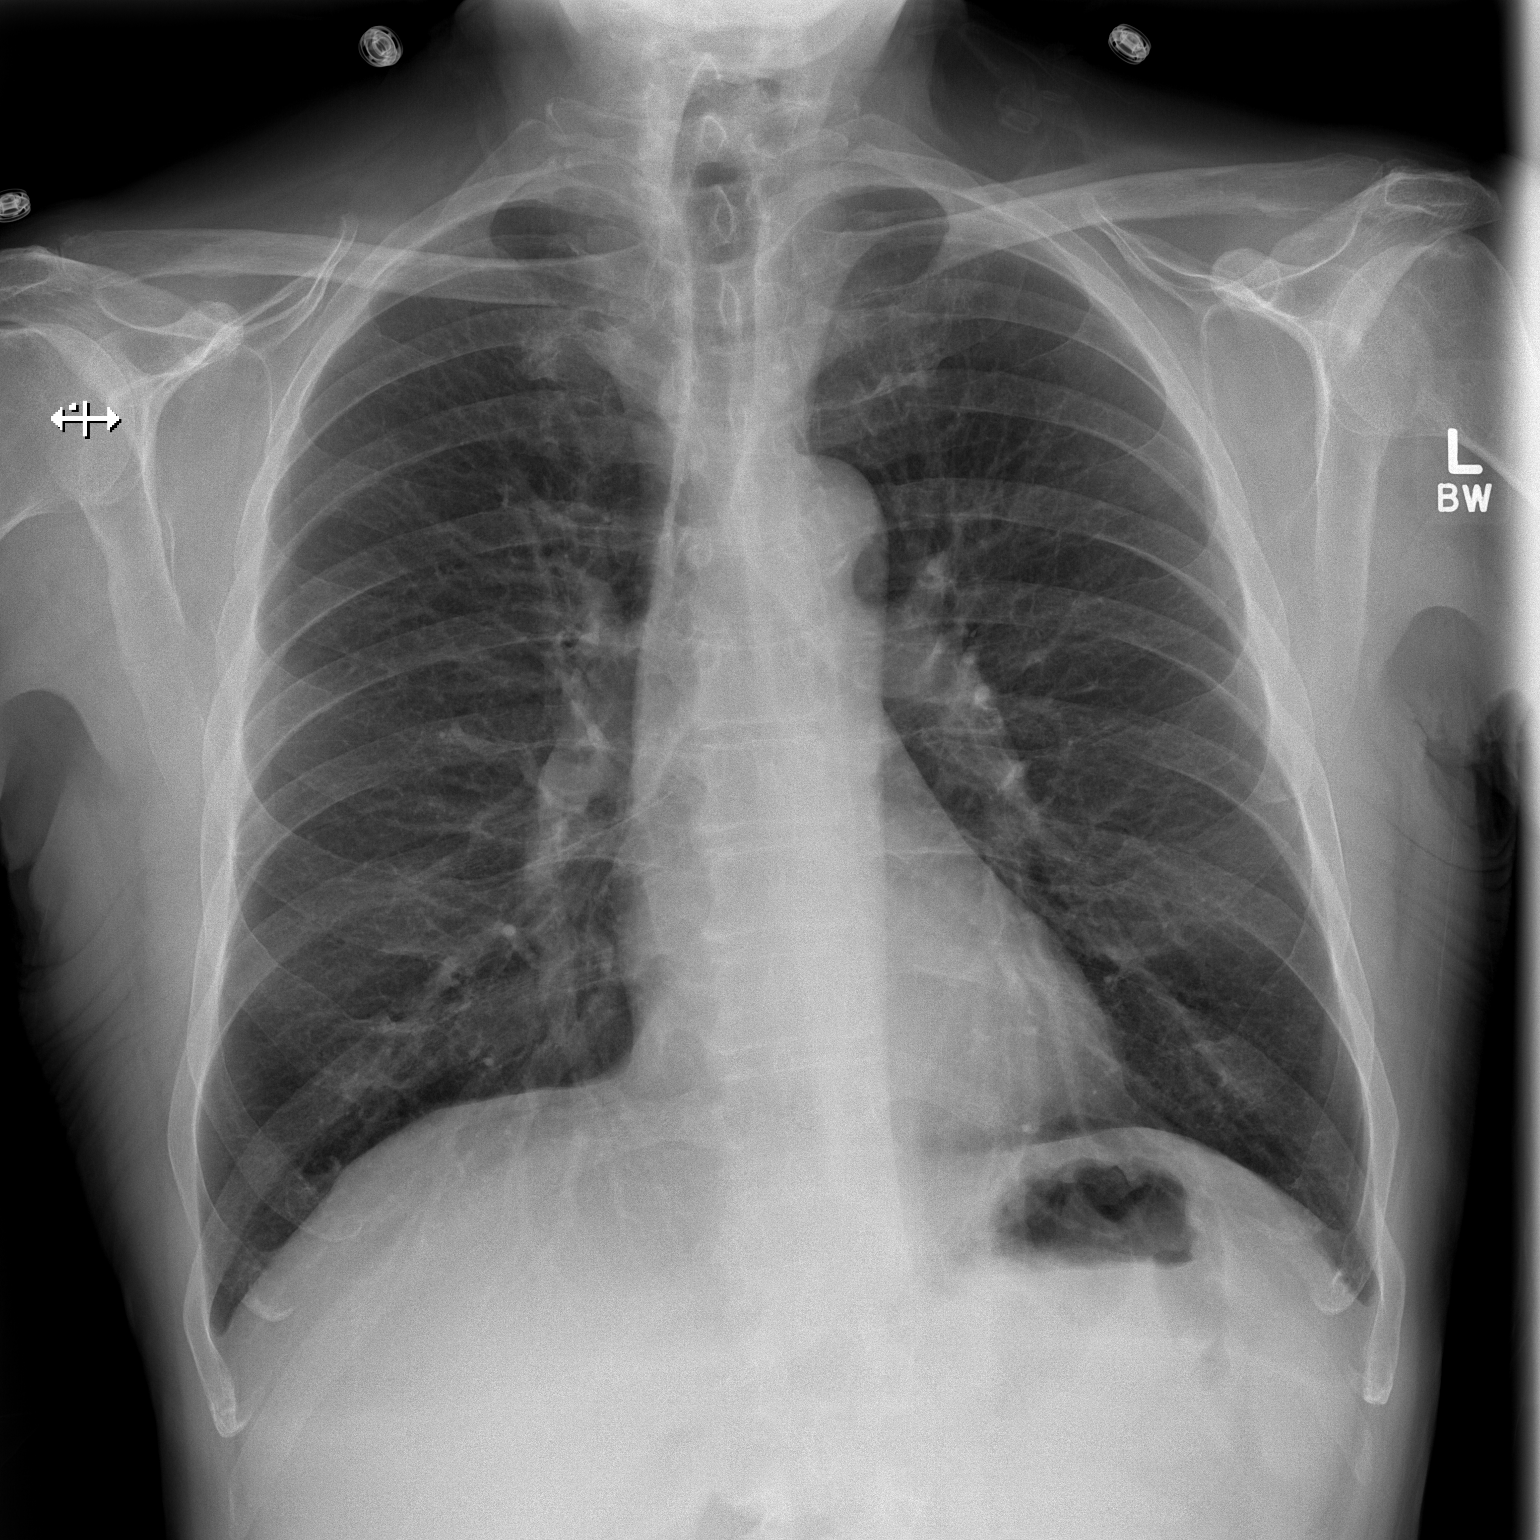

[w chest lat]
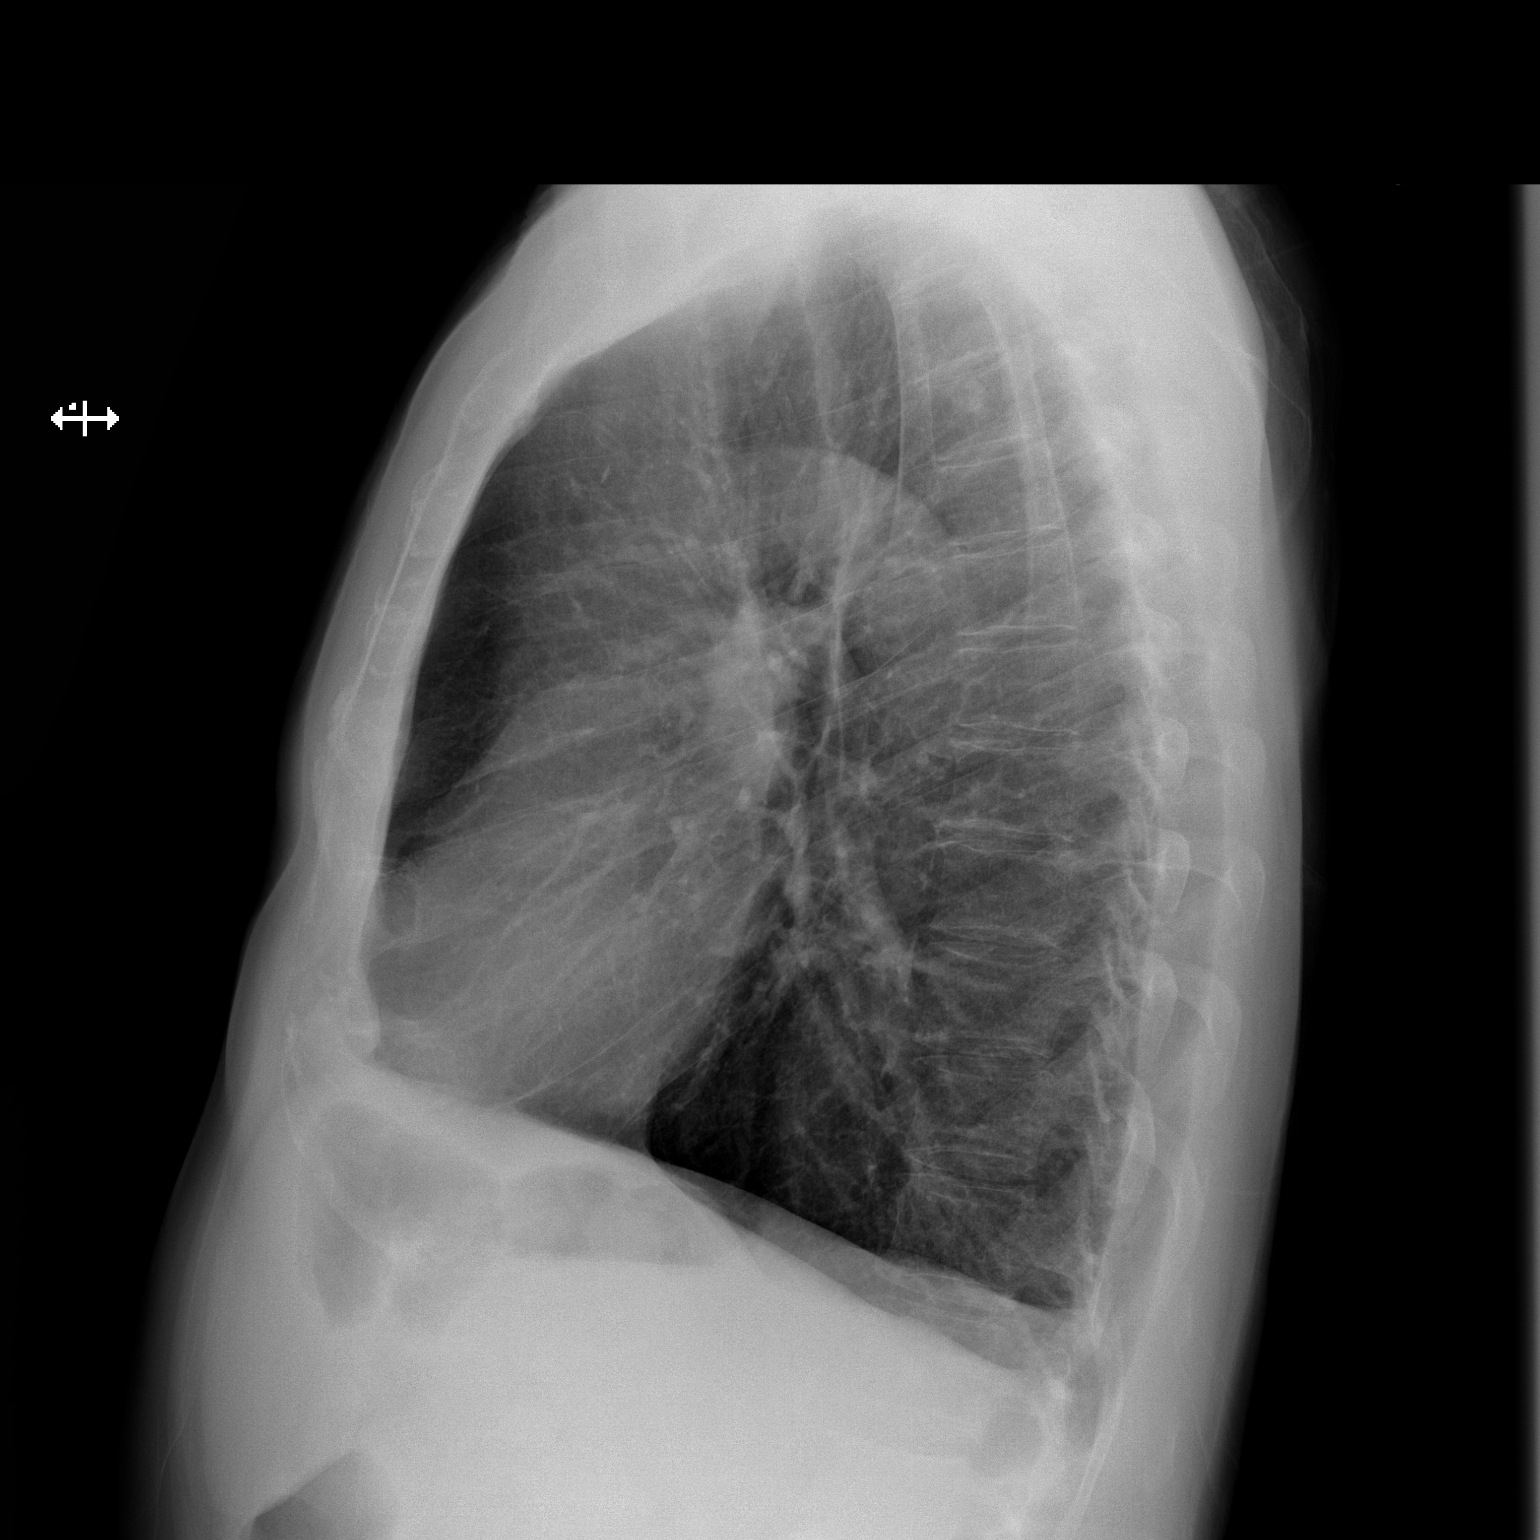

[2 of 2 positions shown; findings below may reference images not displayed]

FINDINGS: Normal heart size and mediastinal contours. Mild hyperinflation. No
acute infiltrate or edema. No effusion or pneumothorax. No acute
osseous findings.
IMPRESSION: 1. No evidence of acute disease.
2. Mild hyperinflation, possible COPD.

## 2017-04-12 IMAGING — CT CT ABD-PELV W/ CM
2 of 5 series · 16 of 46 positions shown, 18 images · IV contrast (OMNIPAQUE 300)
Comparison: None.

CLINICAL DATA: Right-sided back pain, weight loss of greater than
10 pounds.

EXAM:
CT ABDOMEN AND PELVIS WITH CONTRAST
TECHNIQUE: Multidetector CT imaging of the abdomen and pelvis was performed
using the standard protocol following bolus administration of
intravenous contrast.
CONTRAST:  50mL OMNIPAQUE IOHEXOL 300 MG/ML SOLN, 100mL OMNIPAQUE
IOHEXOL 300 MG/ML SOLN

[Series 2: abd/pel with · axial · 0.75mm/px · z∈[-465,-120]mm · 13 of 79 slices shown, 15 images]
[im 5/79  soft-tissue]
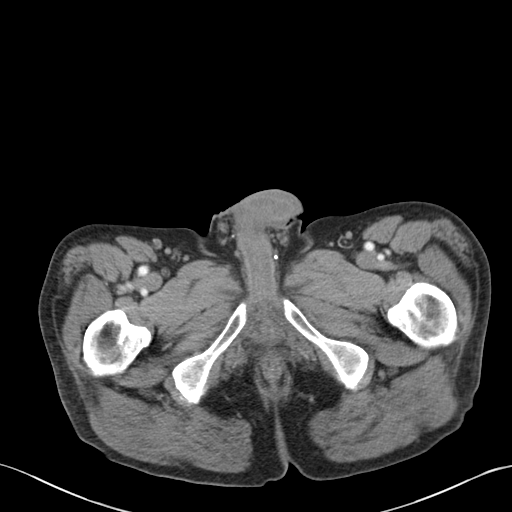
[im 5/79  bone]
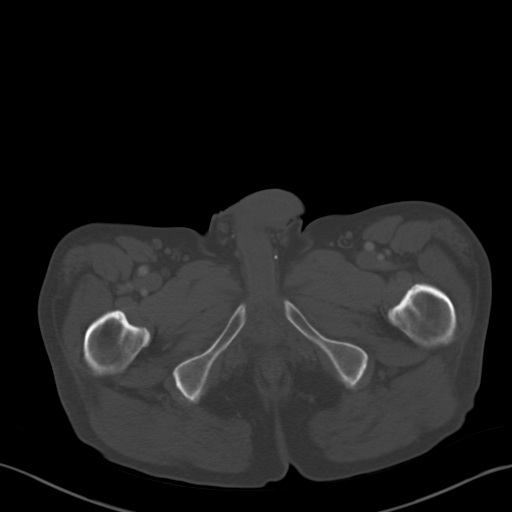
[im 10/79  soft-tissue]
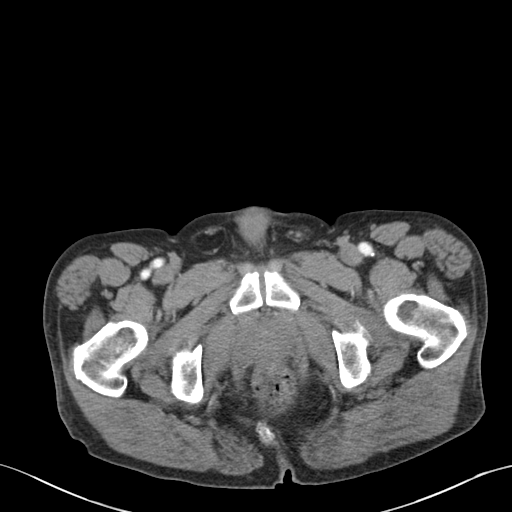
[im 19/79  soft-tissue]
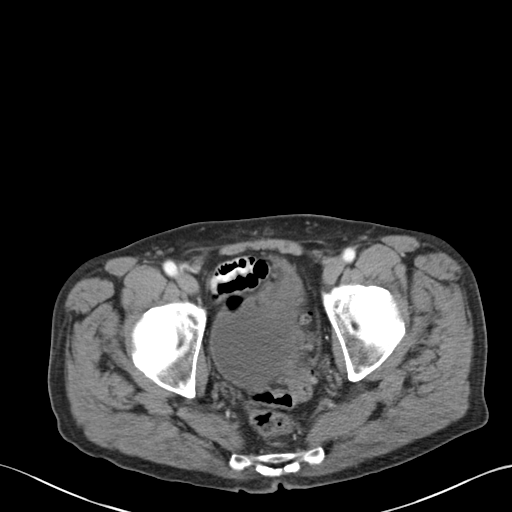
[im 23/79  soft-tissue]
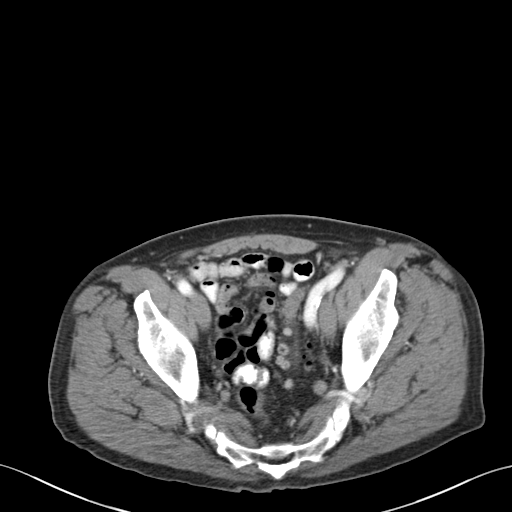
[im 28/79  soft-tissue]
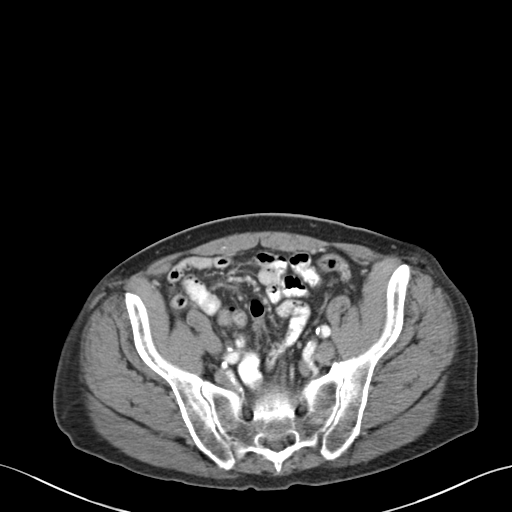
[im 33/79  soft-tissue]
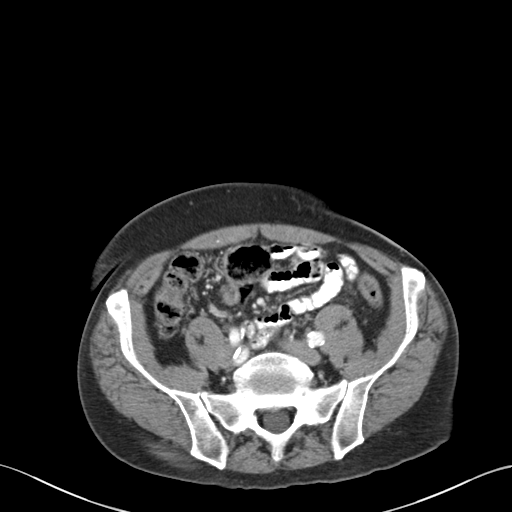
[im 42/79  soft-tissue]
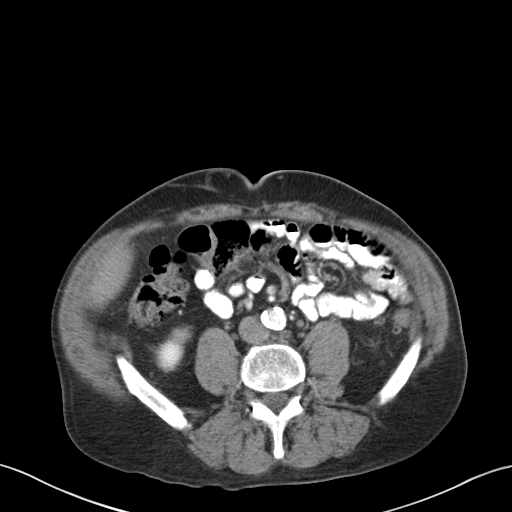
[im 46/79  soft-tissue]
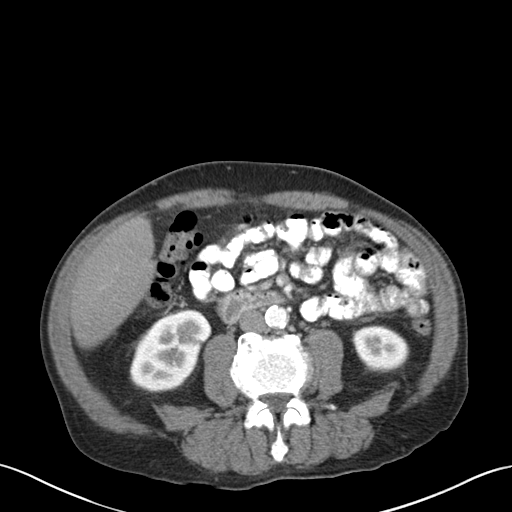
[im 51/79  soft-tissue]
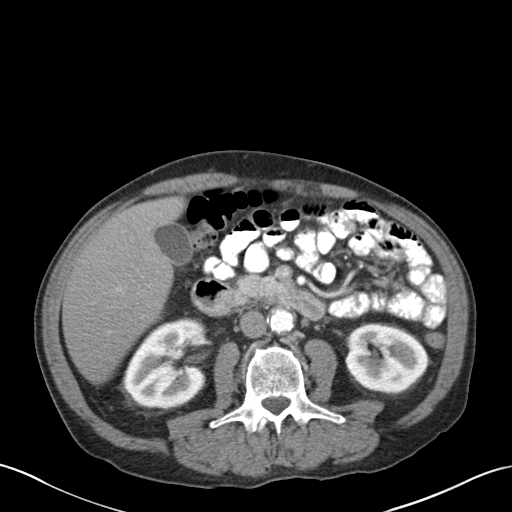
[im 51/79  bone]
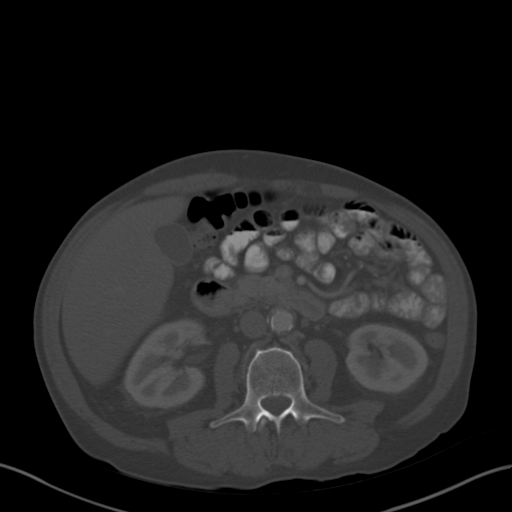
[im 56/79  soft-tissue]
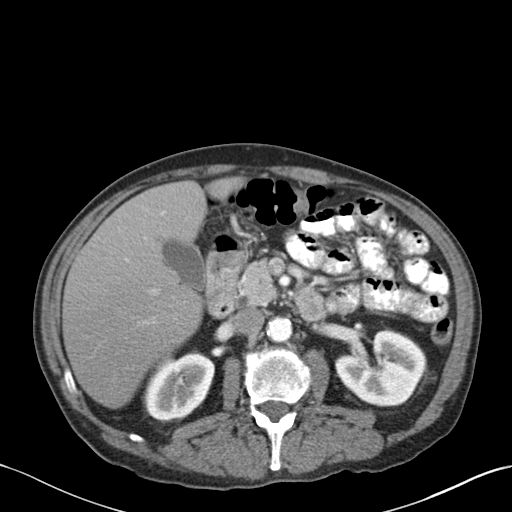
[im 60/79  soft-tissue]
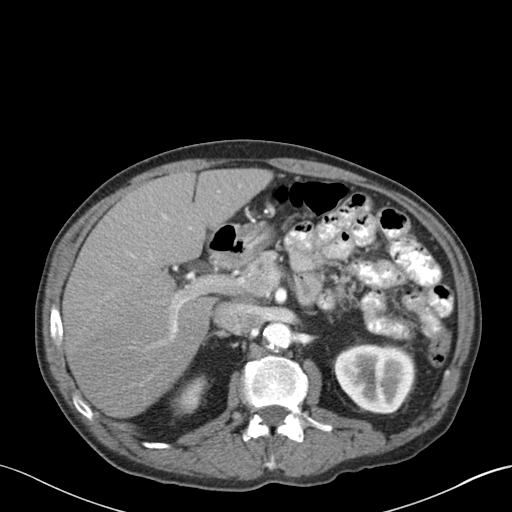
[im 69/79  soft-tissue]
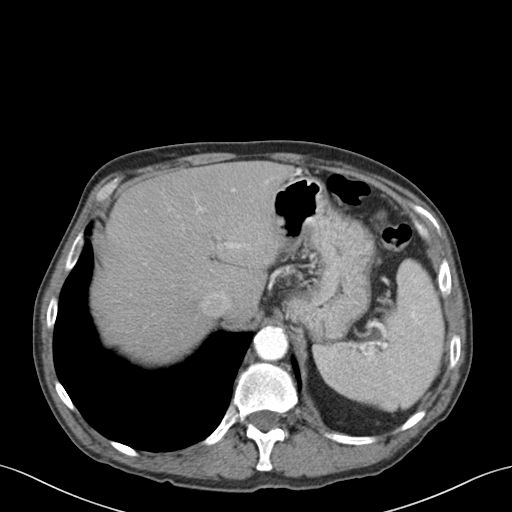
[im 74/79  soft-tissue]
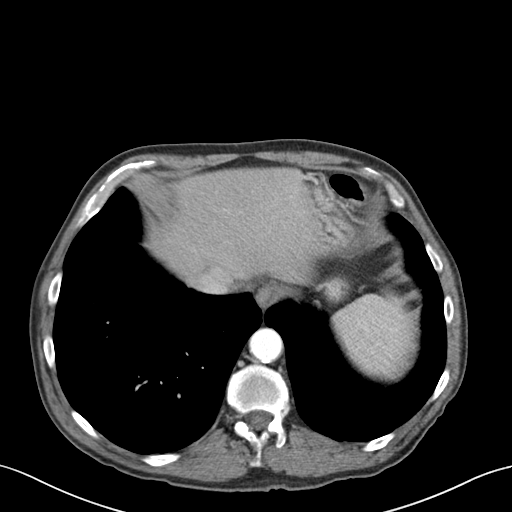

[Series 5: coronal a/|p · coronal · 0.73mm/px · 3 of 85 slices shown]
[im 29/85  soft-tissue]
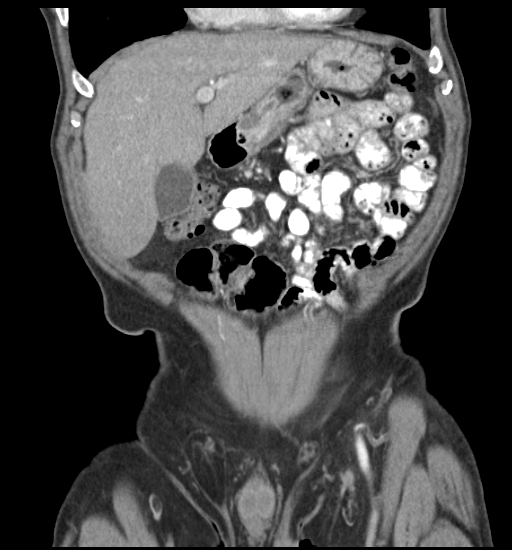
[im 38/85  soft-tissue]
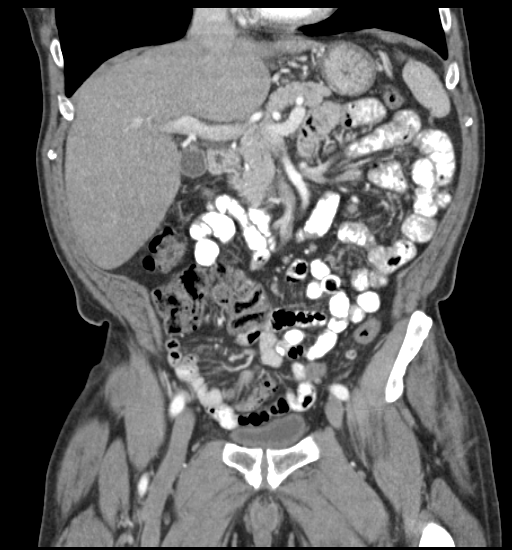
[im 47/85  soft-tissue]
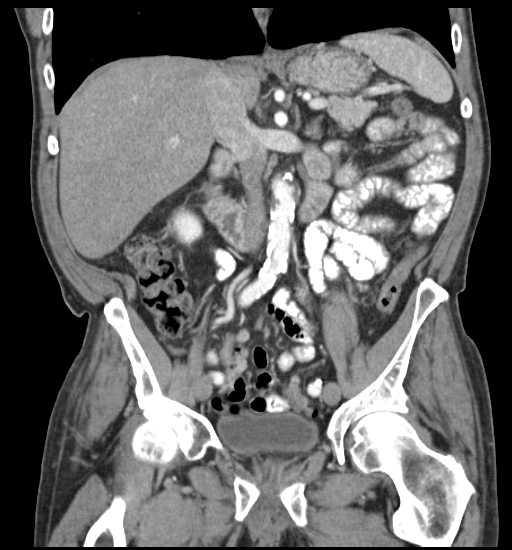

[16 of 46 positions shown; findings below may reference images not displayed]

FINDINGS: Lower chest:  No acute findings.

Hepatobiliary: 8 mm hypodense lesion within the left hepatic lobe,
too small to definitively characterize but probably a small cyst.
Liver otherwise normal. Gallbladder appears normal. No bile duct
dilatation.

Pancreas: No mass, inflammatory changes, or other significant
abnormality.

Spleen: Within normal limits in size and appearance.

Adrenals/Urinary Tract: Adrenal glands appear normal. Kidneys appear
normal without mass, cyst, stone or hydronephrosis. No ureteral or
bladder calculi identified.

Stomach/Bowel: Bowel is normal in caliber. No bowel wall thickening
or evidence of bowel wall inflammation seen. Scattered
diverticulosis noted within the descending and sigmoid colon but no
focal inflammatory change to suggest acute diverticulitis. Appendix
is not convincingly seen but there are no inflammatory changes about
the cecum to suggest acute appendicitis.

Vascular/Lymphatic: Scattered atherosclerotic changes of the
normal-caliber abdominal aorta. Additional atherosclerotic changes
throughout the pelvic vasculature. No acute vascular abnormality
seen. No enlarged lymph nodes identified within the abdomen or
pelvis.

Reproductive: Prostate gland is moderately enlarged causing slight
mass effect on the bladder base. Otherwise unremarkable.

Other: No free fluid or abscess collections seen. No soft tissue
mass identified in the abdomen or pelvis. No free intraperitoneal
air.

Musculoskeletal: Mild degenerative change throughout the slightly
scoliotic thoracolumbar spine but no acute osseous abnormality.
Superficial soft tissues are unremarkable.
IMPRESSION: 1. No evidence of acute intra-abdominal or intrapelvic abnormality.
No evidence of neoplastic process within the abdomen or pelvis.
2. Colonic diverticulosis without evidence of acute diverticulitis.
3. Fairly prominent atherosclerotic changes throughout the abdominal
aorta and pelvic vasculature.
4. Moderate prostate gland enlargement. Recommend correlation with
physical exam findings and/or PSA lab values.
